# Patient Record
Sex: Male | Born: 1950 | Race: White | Hispanic: No | Marital: Married | Smoking: Never smoker
Health system: Southern US, Community
[De-identification: ages and names within clinical notes are randomized; demographics above are authoritative.]

## PROBLEM LIST (undated history)

## (undated) DIAGNOSIS — N200 Calculus of kidney: Secondary | ICD-10-CM

## (undated) DIAGNOSIS — K589 Irritable bowel syndrome without diarrhea: Secondary | ICD-10-CM

## (undated) DIAGNOSIS — I10 Essential (primary) hypertension: Secondary | ICD-10-CM

## (undated) DIAGNOSIS — K219 Gastro-esophageal reflux disease without esophagitis: Secondary | ICD-10-CM

## (undated) DIAGNOSIS — J45909 Unspecified asthma, uncomplicated: Secondary | ICD-10-CM

## (undated) DIAGNOSIS — K56609 Unspecified intestinal obstruction, unspecified as to partial versus complete obstruction: Secondary | ICD-10-CM

## (undated) DIAGNOSIS — Z87442 Personal history of urinary calculi: Secondary | ICD-10-CM

## (undated) DIAGNOSIS — G4730 Sleep apnea, unspecified: Secondary | ICD-10-CM

## (undated) HISTORY — PX: BACK SURGERY: SHX140

## (undated) HISTORY — PX: ANTERIOR CRUCIATE LIGAMENT REPAIR: SHX115

## (undated) HISTORY — PX: EXPLORATORY LAPAROTOMY: SUR591

## (undated) HISTORY — DX: Calculus of kidney: N20.0

## (undated) HISTORY — PX: CHOLECYSTECTOMY: SHX55

## (undated) HISTORY — PX: APPENDECTOMY: SHX54

## (undated) HISTORY — PX: KNEE SURGERY: SHX244

## (undated) HISTORY — DX: Irritable bowel syndrome, unspecified: K58.9

---

## 2000-11-01 ENCOUNTER — Ambulatory Visit (HOSPITAL_COMMUNITY): Admission: RE | Admit: 2000-11-01 | Discharge: 2000-11-01 | Payer: Self-pay | Admitting: Family Medicine

## 2000-11-01 ENCOUNTER — Encounter: Payer: Self-pay | Admitting: Family Medicine

## 2001-02-23 HISTORY — PX: CHOLECYSTECTOMY: SHX55

## 2001-04-11 ENCOUNTER — Emergency Department (HOSPITAL_COMMUNITY): Admission: EM | Admit: 2001-04-11 | Discharge: 2001-04-11 | Payer: Self-pay | Admitting: *Deleted

## 2001-04-11 ENCOUNTER — Encounter: Payer: Self-pay | Admitting: *Deleted

## 2001-04-12 ENCOUNTER — Encounter: Payer: Self-pay | Admitting: Internal Medicine

## 2001-04-12 ENCOUNTER — Ambulatory Visit (HOSPITAL_COMMUNITY): Admission: RE | Admit: 2001-04-12 | Discharge: 2001-04-12 | Payer: Self-pay | Admitting: Internal Medicine

## 2001-04-13 ENCOUNTER — Ambulatory Visit (HOSPITAL_COMMUNITY): Admission: RE | Admit: 2001-04-13 | Discharge: 2001-04-13 | Payer: Self-pay | Admitting: Internal Medicine

## 2001-04-13 ENCOUNTER — Encounter: Payer: Self-pay | Admitting: Internal Medicine

## 2002-02-20 ENCOUNTER — Emergency Department (HOSPITAL_COMMUNITY): Admission: EM | Admit: 2002-02-20 | Discharge: 2002-02-20 | Payer: Self-pay | Admitting: Emergency Medicine

## 2002-02-20 ENCOUNTER — Encounter: Payer: Self-pay | Admitting: Emergency Medicine

## 2002-05-05 ENCOUNTER — Ambulatory Visit (HOSPITAL_COMMUNITY): Admission: RE | Admit: 2002-05-05 | Discharge: 2002-05-05 | Payer: Self-pay | Admitting: Family Medicine

## 2002-05-05 ENCOUNTER — Encounter: Payer: Self-pay | Admitting: Family Medicine

## 2002-07-03 ENCOUNTER — Encounter: Payer: Self-pay | Admitting: Family Medicine

## 2002-07-03 ENCOUNTER — Ambulatory Visit (HOSPITAL_COMMUNITY): Admission: RE | Admit: 2002-07-03 | Discharge: 2002-07-03 | Payer: Self-pay | Admitting: Family Medicine

## 2002-07-25 HISTORY — PX: COLONOSCOPY: SHX174

## 2002-08-17 ENCOUNTER — Ambulatory Visit (HOSPITAL_COMMUNITY): Admission: RE | Admit: 2002-08-17 | Discharge: 2002-08-17 | Payer: Self-pay | Admitting: Family Medicine

## 2002-08-17 ENCOUNTER — Ambulatory Visit (HOSPITAL_COMMUNITY): Admission: RE | Admit: 2002-08-17 | Discharge: 2002-08-17 | Payer: Self-pay | Admitting: Internal Medicine

## 2002-08-17 ENCOUNTER — Encounter: Payer: Self-pay | Admitting: Family Medicine

## 2002-09-07 ENCOUNTER — Encounter (HOSPITAL_COMMUNITY): Admission: RE | Admit: 2002-09-07 | Discharge: 2002-10-07 | Payer: Self-pay | Admitting: General Surgery

## 2002-09-07 ENCOUNTER — Encounter: Payer: Self-pay | Admitting: General Surgery

## 2002-10-04 ENCOUNTER — Encounter: Payer: Self-pay | Admitting: General Surgery

## 2002-10-04 ENCOUNTER — Inpatient Hospital Stay (HOSPITAL_COMMUNITY): Admission: RE | Admit: 2002-10-04 | Discharge: 2002-10-05 | Payer: Self-pay | Admitting: General Surgery

## 2003-04-20 ENCOUNTER — Ambulatory Visit (HOSPITAL_COMMUNITY): Admission: RE | Admit: 2003-04-20 | Discharge: 2003-04-20 | Payer: Self-pay | Admitting: Family Medicine

## 2005-06-25 ENCOUNTER — Ambulatory Visit (HOSPITAL_COMMUNITY): Admission: RE | Admit: 2005-06-25 | Discharge: 2005-06-25 | Payer: Self-pay | Admitting: Family Medicine

## 2006-06-23 ENCOUNTER — Ambulatory Visit: Payer: Self-pay | Admitting: Internal Medicine

## 2006-07-22 ENCOUNTER — Ambulatory Visit: Payer: Self-pay | Admitting: Internal Medicine

## 2007-03-11 ENCOUNTER — Ambulatory Visit (HOSPITAL_COMMUNITY): Admission: RE | Admit: 2007-03-11 | Discharge: 2007-03-11 | Payer: Self-pay | Admitting: Family Medicine

## 2007-06-08 ENCOUNTER — Ambulatory Visit (HOSPITAL_COMMUNITY): Admission: RE | Admit: 2007-06-08 | Discharge: 2007-06-08 | Payer: Self-pay | Admitting: Family Medicine

## 2007-06-14 ENCOUNTER — Ambulatory Visit (HOSPITAL_COMMUNITY): Admission: RE | Admit: 2007-06-14 | Discharge: 2007-06-14 | Payer: Self-pay | Admitting: Family Medicine

## 2007-07-25 ENCOUNTER — Ambulatory Visit (HOSPITAL_COMMUNITY): Payer: Self-pay | Admitting: Oncology

## 2007-07-25 ENCOUNTER — Encounter (HOSPITAL_COMMUNITY): Admission: RE | Admit: 2007-07-25 | Discharge: 2007-08-24 | Payer: Self-pay | Admitting: Oncology

## 2007-08-31 ENCOUNTER — Encounter (HOSPITAL_COMMUNITY): Admission: RE | Admit: 2007-08-31 | Discharge: 2007-09-30 | Payer: Self-pay | Admitting: Oncology

## 2007-09-14 ENCOUNTER — Ambulatory Visit: Payer: Self-pay | Admitting: Internal Medicine

## 2007-10-03 ENCOUNTER — Encounter (INDEPENDENT_AMBULATORY_CARE_PROVIDER_SITE_OTHER): Payer: Self-pay | Admitting: Interventional Radiology

## 2007-10-03 ENCOUNTER — Ambulatory Visit (HOSPITAL_COMMUNITY): Admission: RE | Admit: 2007-10-03 | Discharge: 2007-10-03 | Payer: Self-pay | Admitting: Internal Medicine

## 2008-01-11 ENCOUNTER — Ambulatory Visit: Payer: Self-pay | Admitting: Internal Medicine

## 2008-01-11 ENCOUNTER — Ambulatory Visit (HOSPITAL_COMMUNITY): Admission: RE | Admit: 2008-01-11 | Discharge: 2008-01-11 | Payer: Self-pay | Admitting: Internal Medicine

## 2008-01-18 ENCOUNTER — Ambulatory Visit (HOSPITAL_COMMUNITY): Admission: RE | Admit: 2008-01-18 | Discharge: 2008-01-18 | Payer: Self-pay | Admitting: General Surgery

## 2008-03-15 ENCOUNTER — Ambulatory Visit (HOSPITAL_COMMUNITY): Payer: Self-pay | Admitting: Oncology

## 2008-03-15 ENCOUNTER — Encounter (HOSPITAL_COMMUNITY): Admission: RE | Admit: 2008-03-15 | Discharge: 2008-04-14 | Payer: Self-pay | Admitting: Oncology

## 2008-04-16 ENCOUNTER — Ambulatory Visit (HOSPITAL_COMMUNITY): Admission: RE | Admit: 2008-04-16 | Discharge: 2008-04-16 | Payer: Self-pay | Admitting: Family Medicine

## 2008-10-05 ENCOUNTER — Ambulatory Visit (HOSPITAL_BASED_OUTPATIENT_CLINIC_OR_DEPARTMENT_OTHER): Admission: RE | Admit: 2008-10-05 | Discharge: 2008-10-05 | Payer: Self-pay | Admitting: Otolaryngology

## 2008-10-05 ENCOUNTER — Encounter (INDEPENDENT_AMBULATORY_CARE_PROVIDER_SITE_OTHER): Payer: Self-pay | Admitting: Otolaryngology

## 2008-10-26 ENCOUNTER — Telehealth (INDEPENDENT_AMBULATORY_CARE_PROVIDER_SITE_OTHER): Payer: Self-pay | Admitting: *Deleted

## 2009-08-15 ENCOUNTER — Ambulatory Visit (HOSPITAL_COMMUNITY): Admission: RE | Admit: 2009-08-15 | Discharge: 2009-08-15 | Payer: Self-pay | Admitting: Family Medicine

## 2009-11-08 ENCOUNTER — Inpatient Hospital Stay (HOSPITAL_COMMUNITY): Admission: EM | Admit: 2009-11-08 | Discharge: 2009-11-11 | Payer: Self-pay | Admitting: Emergency Medicine

## 2010-05-08 LAB — COMPREHENSIVE METABOLIC PANEL
ALT: 15 U/L (ref 0–53)
Albumin: 3.5 g/dL (ref 3.5–5.2)
Alkaline Phosphatase: 43 U/L (ref 39–117)
BUN: 20 mg/dL (ref 6–23)
CO2: 26 mEq/L (ref 19–32)
GFR calc non Af Amer: >60 mL/min (ref >60)
Potassium: 3.6 mEq/L (ref 3.5–5.1)
Total Protein: 6.6 g/dL (ref 6.0–8.3)

## 2010-05-08 LAB — CBC
Platelets: 124 10*3/uL — ABNORMAL LOW (ref 150–400)
RDW: 13.4 % (ref 11.5–15.5)
WBC: 3.5 10*3/uL — ABNORMAL LOW (ref 4.0–10.5)

## 2010-05-08 LAB — DIFFERENTIAL
Basophils Absolute: 0 10*3/uL (ref 0.0–0.1)
Lymphocytes Relative: 21 % (ref 12–46)
Lymphs Abs: 0.7 10*3/uL (ref 0.7–4.0)
Monocytes Relative: 16 % — ABNORMAL HIGH (ref 3–12)
Neutrophils Relative %: 61 % (ref 43–77)

## 2010-05-08 LAB — URINE MICROSCOPIC-ADD ON

## 2010-05-08 LAB — URINALYSIS, ROUTINE W REFLEX MICROSCOPIC
Leukocytes, UA: NEGATIVE
Nitrite: NEGATIVE
Urobilinogen, UA: 0.2 mg/dL (ref 0.0–1.0)
pH: 6 (ref 5.0–8.0)

## 2010-05-08 LAB — LIPASE, BLOOD: Lipase: 29 U/L (ref 11–59)

## 2010-05-08 LAB — AMYLASE: Amylase: 32 U/L (ref 0–105)

## 2010-06-09 LAB — CBC
HCT: 41.4 % (ref 39.0–52.0)
Hemoglobin: 14.1 g/dL (ref 13.0–17.0)
MCV: 85.4 fL (ref 78.0–100.0)
Platelets: 142 10*3/uL — ABNORMAL LOW (ref 150–400)
RBC: 4.85 MIL/uL (ref 4.22–5.81)

## 2010-07-08 NOTE — Assessment & Plan Note (Signed)
NAME:  Justin [name], Justin [name]               CHART#:  [id number]   DATE:  01/11/2008                       DOB:  01-Nov-1950   The patient has done very well from standpoint of his irritable bowel  syndrome symptoms.  However, he developed acute symptoms characterized  by abdominal distention, pain, nausea, vomiting, and diminution in bowel  movements over the past 3 days.  Pain became severe yesterday.  He saw  the folks down at [facility].  He was given Avelox and  some oxycodone and was told to follow up.  He called in yesterday, we  arranged to see him today.  He stated overnight, he felt the abdominal  pressure distention resolving and he started passing gas and had a bowel  movement.  His pain has become much improved.  He relates a similar  episode back in 2007 which also resolved spontaneously.   Back in 1997, he had a similar episode for which he was admitted down at  [facility] and had surgery and from his description, there were  some adhesive disease producing a small bowel obstruction.  Generally,  he has done very well, has altering constipation, diarrhea, and takes  Align 1 capsule daily chronically.  He has been worked up here for a  splenomegaly.  He does have a mild fatty-appearing liver on CT.  Moreover, he had a transjugular liver biopsy and had hepatic vein wedge  pressure measurements done, recent which demonstrated bland steatosis  and no evidence of cirrhosis based on histology or portal wedge  pressures.  He has not had fever or chills.  He is feeling much better  today.   CURRENT MEDICATIONS:  See her updated list in addition to above  symptoms.  He has chronic gastroesophageal reflux disease for which  Nexium 40 mg orally daily control symptoms nicely.   ALLERGIES:  No known drug allergies.   PHYSICAL EXAMINATION:  GENERAL:  He is accompanied by his wife.  VITAL SIGNS:  Weight 213, height 6 feet, temperature 98, BP 112/80, and  pulse 76.  SKIN:  Warm and dry.  There is no jaundice or cutaneous stigmata of  chronic liver disease.  CHEST:  Lungs are clear to auscultation.  CARDIAC:  Regular rate and rhythm without murmur, gallop, or rub.  ABDOMEN:  Full.  He has bowel sounds.  There is marked increased tympany  throughout both upper quadrants.  He has mild tenderness in these areas,  but certainly no appreciable mass.  Spleen is barely ballottable.  There  is no guarding, rebound, or tenderness.  EXTREMITIES:  Have trace lower extremity edema.   IMPRESSION:  A 61 year old gentleman with stable irritable bowel  syndrome symptoms and gastroesophageal reflux disease with acute illness  which has spontaneously improved over the past 24 hours, highly  suspicious for partial small bowel obstruction.  He had a similar a self-  limiting episode in 2007 and had surgery and what sounds like lysis of  adhesions about 12 years ago down in [place].  At this point, he did  have a CT in June 2009 which demonstrated no bowel abnormalities even  though this process seems to be resolving on his own, it will be worth  while he to go ahead and get a CT of his abdomen and pelvis with IV  normal contrast, so forth see if we cannot localize problem.  The  frustrating nature of adhesions I reviewed with the patient and the  patient's wife.  A short of an out nap ongoing obstruction, treatment  options quite limited short of surgery.  I have told the patient as in  the absence of an ongoing obstruction or other surgical process, he is  not likely going to be offered an operation by a Careers adviser.  Moreover, CT  scan would help [place] rule out other potential underlying pathology  contributing to this acute illness.  At this point, he certainly does  not look acutely ill or toxic and do not feel there is any type of  emergent surgical process.  Pending review of CT.  We will make further  recommendations in the very near future.   As far as his  splenomegaly is concerned, he has been thoroughly  evaluated from standpoint of potential underlying chronic liver disease  and I have advised him to follow up with Dr. Sherwood Gambler and Dr. Mariel Sleet as  previously directed.       Jonathon Bellows, M.D.  Electronically Signed     RMR/MEDQ  D:  01/11/2008  T:  01/12/2008  Job:  [id number]   cc:   Ladona Horns. Mariel Sleet, MD  Madelin Rear. Sherwood Gambler, MD

## 2010-07-08 NOTE — Op Note (Signed)
NAME:  Justin, [name] NO.:  [id number]   MEDICAL RECORD NO.:  [id number]          PATIENT TYPE:  AMB   LOCATION:  DSC                          FACILITY:  [facility]   PHYSICIAN:  Kristine Garbe. Ezzard Standing, M.D.DATE OF BIRTH:  Jul 18, 1950   DATE OF PROCEDURE:  10/05/2008  DATE OF DISCHARGE:                               OPERATIVE REPORT   PREOPERATIVE DIAGNOSIS:  Right buccal mucosa polypoid lesion, 1 to 1.5  cm.   POSTOPERATIVE DIAGNOSIS:  Right buccal mucosa polypoid lesion, 1 to 1.5  cm.   OPERATION:  Excision of right buccal mucosa polypoid lesion with simple  closure.   SURGEON:  Kristine Garbe. Ezzard Standing, MD   ANESTHESIA:  Local 1% Xylocaine with epinephrine.   COMPLICATIONS:  None.   BRIEF CLINICAL NOTE:  Justin [name] is a 60 year old gentleman who has  had a polypoid lesion of the right buccal mucosa for several years.  He  intermittently bites it and it causes problems and bleeds occasionally.  On examination, he has about approximately 1-cm mucosa covered polypoid  lesion with a thick base coming off the right buccal mucosa.  He is  taken to the operating room at this time for excision under local  anesthetic.   DESCRIPTION OF PROCEDURE:  The patient was brought to the operating  room.  The polypoid lesion was injected with 2 mL of Xylocaine with  epinephrine.  The polyp was then excised and sent to pathology and the  small defect was closed with 2 interrupted 5-0 chromic sutures.  Justin [name]  tolerated this well and was subsequently discharged home.   DISPOSITION:  Justin [name] is discharged home on Tylenol and Motrin p.r.n. pain.  He will call our office next week concerning results of the pathology.  He will call our office if he has any questions or problems.           ______________________________  Kristine Garbe Ezzard Standing, M.D.     CEN/MEDQ  D:  10/05/2008  T:  10/05/2008  Job:  [id number]   cc:   Kirk Ruths, M.D.

## 2010-07-08 NOTE — Assessment & Plan Note (Signed)
NAMEMarland Kitchen  HARRIE, [name]               CHART#:  [id number]   DATE:  07/22/2006                       DOB:  10/05/1950   FOLLOWUP:  1. Abdominal bloating, constipation and alternating diarrhea      consistent with irritable bowel syndrome.  2. Gastroesophageal reflux disease.  3. Splenomegaly, etiology uncertain.  4. Self-limiting epigastric right upper quadrant pain 2004, negative      EGD.  Screening colonoscopy negative June 2004.   Justin [name] came to see me at the request of Dr. Mila [name] June 23, 2006  for flare of his irritable syndrome symptoms.  He was hemoccult  negative.  [name] antibody panel chem. 20 sed rate.  CBC all came back  entirely normal, except for a slight suppressed white count of 3.4,  platelet count 160,000.  Ultrasound at [name] demonstrated  an enlarged spleen.  He has done well on Benefiber 1 tablespoon daily,  Align probiotic supplement, and Levbid each morning.  It has really  turned him around.  He is not having anymore abdominal cramps or  bloating.  He feels well.  He is eating well.  He has lost 3 pounds, but  he is trying to keep his weight in line.  Reflux symptoms are well  controlled on Nexium.   CURRENT MEDICATIONS:  See updated list.   ALLERGIES:  NO KNOWN DRUG ALLERGIES:   EXAMINATION:  He looks well.  Weight 213.  Height 6 feet.  BP 136/92.  Pulse 72.  SKIN:  Warm and dry.  CARDIAC EXAM:  Regular rate and rhythm without murmur, gallop, or rub.  ABDOMEN:  Non-distended.  Positive bowel sounds.  I do ballot the  spleen.  The abdomen is entirely non-tender.   ASSESSMENT:  Abdominal bloating, discomfort, alternating constipation  and diarrhea.  Almost certainly irritable bowel syndrome is the  diagnosis.  Reflux symptoms well controlled on Nexium.  I had a length  discussion with him today about the benign, but chronic nature of  irritable bowel syndrome.   RECOMMENDATIONS:  1. Benefiber is going to be the foundation of his  bowel regimen, 1      tablespoon every day from here on out. He may come on and off Align      as needed on daily to every other day as a probiotic supplement.  2. Would reserve Levbid in case of flares of abdominal bloating and      diarrhea.  3. Splenomegaly.  Uncertain etiology.  Justin [name] is slated to see      Dr. Regino [name] back in      August.  I would suggest a one-time referral to the hematologist to      get his opinion.  Otherwise, I will plan to see this nice gentleman      back in 1 year.       Justin [name], M.D.  Electronically Signed     RMR/MEDQ  D:  07/22/2006  T:  07/22/2006  Job:  [id number]   cc:   Justin [name], M.D.

## 2010-07-08 NOTE — Assessment & Plan Note (Signed)
NAME:  Justin [name], Justin [name]               CHART#:  [id number]   DATE:  09/14/2007                       DOB:  1951-01-08   He was seen here earlier this year with abdominal bloating,  constipation, alternating constipation and diarrhea.  We started him on  some Levbid and Align.  He did not feel Levbid made much difference, but  however he feels thatAlign is a miracle drug, it normalizes his bowel  function and minimizes his abdominal bloating.  He has been taking it  effectively.  I noted he had slightly depressed platelet count and  enlarged spleen on imaging.  He saw Dr. Mariel Sleet, who has worked him up  and was concerned about the possibility of occult liver disease with  some fatty changes on prior CT.  His LFTs have been repeatedly normal.  There is no family history of liver disease.  I do not recall Dr. Katrinka Blazing  finding anything at the time of laparoscopic cholecystectomy that  suggests chronic liver disease, but that operative report is not  available for review.   EGD by me in 2004 demonstrated no upper GI tract changes consistent with  portal gastropathy and cirrhosis.  The patient has never consumed  alcohol, and again he has had repeatedly normal LFTs.  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   There was a foul-up on some labs or laboratory error at the Dr.  Thornton Papas office.  At one point, he was found to be anemic at 9.8, but  the repeat labs demonstrated a normal hemoglobin per patient report and  his platelet count was more recently determined to be normal as well.  He was Hemoccult negative x6 through Dr. Thornton Papas office.  Colonoscopy back on August 17, 2002, demonstrated a long, tortuous  otherwise normal colon.  Colonoscopy at that time was done for  screening.   The patient weighs 260 pounds and is 6 feet in stature.  He at one point  weighed in the mid 250s, his weight is at least as little as 180 pounds  and according to height and  body charts he ought to be running between  155 but 170 pounds.   The patient  tells me he has had a low platelet count for at least 20  years on routine lab studies as was reported to him by several different  physicians.   CURRENT MEDICATIONS:  1. Nexium 40 mg orally daily.  2. Align once daily.  3. Benefiber p.r.n.   ALLERGIES:  No known drug allergies.   PAST MEDICAL HISTORY:  Irritable bowel syndrome.   PAST SURGICAL HISTORY:  Past surgeries aside from I mentioned above,  appendectomy, bilateral knee surgery, and vasectomy.   ALLERGIES:  He has no known drug allergies.   FAMILY HISTORY:  Negative for chronic GI or liver illness.   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  He  chews tobacco and no alcohol.   PHYSICAL EXAMINATION:  GENERAL:  A pleasant, tanned gentleman in no  acute distress.  VITAL SIGNS:  Weight 216, height 6 feet, temp 98.6, BP 130/88, and pulse  __________.  SKIN:  Warm and dry, tanned, no cutaneous stigmata of chronic liver  disease.  HEENT:  No scleral icterus. Conjunctivae are pink.  CHEST:  Lungs are clear to auscultation.  CARDIAC:  Regular rate and rhythm without murmur, gallop, or rub.  ABDOMEN:  Flat.  Positive bowel sounds.  Soft.  I believe I do  __________ his spleen on today's exam.  His liver is not enlarged by  percussion.  There are no masses.  EXTREMITIES:  No edema.   IMPRESSION:  The patient is a pleasant 60 year old gentleman with  irritable bowel syndrome, those symptoms are quiescent now.  He does  have splenomegaly and low platelet count; however, the LFT assays I have  on him demonstrate complete normal LFTs.  He is somewhat over his ideal  body weight and some mild fatty changes suggested on recent CT scan.  However, I doubt the patient has anywhere near advanced chronic liver  disease.  He has albumin of 4.0 which implies preserved hepatic  synthetic function.  He may have an element of  steatosis from being  overweight, genetic factors may also be at play.  He is tanned, but he  works outside all the time.  I would like to review Dr. Michaelle Copas  operative notes to see if he has mentioned anything abnormal about the  liver or see if liver biopsy by chance was done, which I doubt.   RECOMMENDATIONS:  1. In this setting, I will go ahead and screen him for viral hepatitis      even though only assay is done along the way.  I do not see any      elevations in transaminases, therefore we will go ahead and proceed      with a hepatitis B surface antigen and hepatitis C antibody and      screening for iron overload.  We will review Dr. Michaelle Copas operative      note and will make further recommendations in the very near future.      As far as his IBS symptoms are concerned, he is getting a lot of      mileage out of nothing more than a probiotic.  I have recommended      he continue with Align 1 Gelcap daily indefinitely.  Further      recommendations to follow.  2. Strive to achieve an ideal body weight realistically.  If he could      get his weight down towards 180 pounds, I think that would be      great.  Regular aerobic exercise was also emphasized.       Justin [name], M.D.  Electronically Signed     RMR/MEDQ  D:  09/14/2007  T:  09/15/2007  Job:  [id number]   cc:   Ladona Horns. Mariel Sleet, MD  Madelin Rear. Sherwood Gambler, MD

## 2010-07-11 NOTE — Discharge Summary (Signed)
NAME:  Justin [name], Justin [name]                        ACCOUNT NO.:  [id number]   MEDICAL RECORD NO.:  [id number]                   PATIENT TYPE:  INP   LOCATION:  A201                                 FACILITY:  [facility]   PHYSICIAN:  Dirk Dress. Katrinka Blazing, M.D.                DATE OF BIRTH:  [birth year]   DATE OF ADMISSION:  10/03/2002  DATE OF DISCHARGE:  10/05/2002                                 DISCHARGE SUMMARY   DISCHARGE DIAGNOSIS:  Chronic cholecystitis with severe biliary colic and  extensive intra-abdominal adhesions.   SPECIAL PROCEDURE:  October 03, 2002, open cholecystectomy.   DISPOSITION:  The patient is discharged home in stable and improved  condition.   DISCHARGE MEDICATIONS:  1. Tylox 2 every four hours as needed for pain.  2. Nexium 40 mg daily.  3. Albuterol MDI 2 puffs q.i.d.   FOLLOW-UP:  The patient is scheduled to be seen in the office on August 18  and will have follow-up with Dr. Regino Schultze on October 19, 2002.   SUMMARY:  This is a 60 year old male with a history of recurrent epigastric  pain and nausea without vomiting.  He had episodes of nausea, bloating, and  diarrhea.  EGD and colonoscopy were normal.  Ultrasound was normal.  HIDA  scan showed decreased ejection fraction.  The patient remained symptomatic  and was scheduled for laparoscopic cholecystectomy.  He had no other major  illnesses.  Physical examination was normal except for mild epigastric  tenderness.   The patient was admitted through Day Surgery.  He was originally scheduled  for laparoscopic cholecystectomy.  I was unable to gain access to the  peritoneal cavity with open dissection, so he was converted from a  laparoscopic procedure to open cholecystectomy, which was done uneventfully.  In the early postoperative period the patient had an episode of  hypoventilation after receiving morphine sulfate IV.  He was then later  given Narcan.  After he was given Narcan his O2 saturations increased.  He  was still mildly hypoxemic on 4 liters of O2 with saturation of 93%.  He had  a good ventilatory effort with respirations.  He was transferred to CC NT  for continuous O2 saturation monitoring and monitoring of blood pressure and  pulse.  Chest x-ray was unremarkable.  The morphine sulfate was  discontinued, and he was given Toradol for pain.  He remained stable from  that point on.  The following morning his oxygen saturation on rule out was  94%.  He was doing well.  His lungs were clear, vital signs were stable, and  he was discharged home in satisfactory condition.     ___________________________________________                                         Dirk Dress Katrinka Blazing,  M.D.   LCS/MEDQ  D:  12/17/2002  T:  12/17/2002  Job:  [id number]

## 2010-07-11 NOTE — H&P (Signed)
   NAME:  Justin [name]                        ACCOUNT NO.:  [id number]   MEDICAL RECORD NO.:  [id number]                   PATIENT TYPE:  AMB   LOCATION:  DAY                                  FACILITY:  [facility]   PHYSICIAN:  Dirk Dress. Katrinka Blazing, M.D.                DATE OF BIRTH:  [birth year]   DATE OF ADMISSION:  DATE OF DISCHARGE:                                HISTORY & PHYSICAL   HISTORY OF PRESENT ILLNESS:  Justin [name] is a 60 year old male with  history of epigastric pain and nausea without vomiting.  He had an acute  attack in late June.  He has episodes of bloating, and episodic constipation  and diarrhea.  EGD and colonoscopy were normal.  Gallbladder ultrasound  was  normal.  He was treated with Nexium and NuLev without improvement.  HIDA  scan showed decreased ejection fraction.  The patient remains symptomatic  and is scheduled for laparoscopic cholecystectomy.   PAST HISTORY:  The patient has chronic seasonal allergies.  There is a  history of bilateral arthroscopic knee surgery and there is a history of  exploratory laparotomy.   MEDICATIONS:  1. NuLev 0.125 mg every six hours.  2. Nexium 40 daily.  3. Skelaxin 800 mg t.i.d. p.r.n.   SOCIAL HISTORY:  The patient is employed.  He chews tobacco.  He does not  smoke, drink or use drugs.   FAMILY HISTORY:  Family history is positive for diabetes mellitus.   PHYSICAL EXAMINATION:  VITAL SIGNS:  On examination blood pressure 128/68,  pulse 78, respirations 16 and weight 206 pounds.  HEENT:  Unremarkable.  NECK:  Supple without JVD or bruit.  CHEST:  Clear to auscultation.  HEART: Regular rate and rhythm without murmur, gallop or rub.  ABDOMEN: Mild epigastric tenderness.  Normal bowel sounds.  EXTREMITIES:  No cyanosis, clubbing or edema.  NEUROLOGIC EXAMINATION:  No focal motor, sensory or cerebellar deficits.    IMPRESSION:  Chronic cholecystitis with severe biliary colic.   PLAN:  Laparoscopic  cholecystectomy.                                                 Dirk Dress. Katrinka Blazing, M.D.    LCS/MEDQ  D:  10/02/2002  T:  10/03/2002  Job:  [id number]

## 2010-11-20 LAB — DIFFERENTIAL

## 2010-11-20 LAB — COMPREHENSIVE METABOLIC PANEL
AST: 20
Albumin: 3.9
Calcium: 9.2
Chloride: 107
Creatinine, Ser: 1.12
GFR calc Af Amer: >60
GFR calc Af Amer: >60
Sodium: 143

## 2010-11-20 LAB — IRON AND TIBC
Iron: 86
Saturation Ratios: 26
TIBC: 327
UIBC: 241

## 2010-11-20 LAB — CBC
HCT: 41.9
Hemoglobin: 14.9
MCHC: 35.7
MCV: 84.2
Platelets: 147 — ABNORMAL LOW
RBC: 4.97
RDW: 12.8
WBC: 5.3

## 2010-11-20 LAB — OCCULT BLOOD X 1 CARD TO LAB, STOOL
Fecal Occult Bld: NEGATIVE
Fecal Occult Bld: NEGATIVE
Fecal Occult Bld: NEGATIVE
Fecal Occult Bld: NEGATIVE

## 2010-11-20 LAB — FOLATE: Folate: >20

## 2010-11-20 LAB — FERRITIN

## 2010-11-20 LAB — RETICULOCYTES: Retic Ct Pct: 1.6

## 2010-11-20 LAB — VITAMIN B12: Vitamin B-12: 294 (ref 211–911)

## 2010-11-21 LAB — CBC
MCHC: 34.3
MCV: 86.2
Platelets: 128 — ABNORMAL LOW
RDW: 12.6

## 2010-11-21 LAB — PROTIME-INR: Prothrombin Time: 13.2

## 2010-11-25 LAB — CREATININE, SERUM: Creatinine, Ser: 1.13

## 2010-11-25 LAB — BUN: BUN: 10

## 2011-12-10 ENCOUNTER — Other Ambulatory Visit (HOSPITAL_COMMUNITY): Payer: Self-pay | Admitting: Internal Medicine

## 2011-12-10 ENCOUNTER — Encounter (HOSPITAL_COMMUNITY): Payer: Self-pay | Admitting: Family Medicine

## 2011-12-10 ENCOUNTER — Emergency Department (HOSPITAL_COMMUNITY): Payer: 59

## 2011-12-10 ENCOUNTER — Ambulatory Visit (HOSPITAL_COMMUNITY)
Admission: RE | Admit: 2011-12-10 | Discharge: 2011-12-10 | Disposition: A | Discharge status: Home / Self Care | Payer: 59 | Source: Ambulatory Visit | Attending: Internal Medicine | Admitting: Internal Medicine

## 2011-12-10 ENCOUNTER — Emergency Department (HOSPITAL_COMMUNITY)
Admission: EM | Admit: 2011-12-10 | Discharge: 2011-12-10 | Disposition: A | Discharge status: Home / Self Care | Payer: 59 | Attending: Emergency Medicine | Admitting: Emergency Medicine

## 2011-12-10 DIAGNOSIS — J984 Other disorders of lung: Secondary | ICD-10-CM | POA: Insufficient documentation

## 2011-12-10 DIAGNOSIS — R1013 Epigastric pain: Secondary | ICD-10-CM

## 2011-12-10 DIAGNOSIS — K56600 Partial intestinal obstruction, unspecified as to cause: Secondary | ICD-10-CM

## 2011-12-10 DIAGNOSIS — R109 Unspecified abdominal pain: Secondary | ICD-10-CM | POA: Insufficient documentation

## 2011-12-10 DIAGNOSIS — I10 Essential (primary) hypertension: Secondary | ICD-10-CM | POA: Insufficient documentation

## 2011-12-10 DIAGNOSIS — K56609 Unspecified intestinal obstruction, unspecified as to partial versus complete obstruction: Secondary | ICD-10-CM | POA: Insufficient documentation

## 2011-12-10 DIAGNOSIS — R1110 Vomiting, unspecified: Secondary | ICD-10-CM | POA: Insufficient documentation

## 2011-12-10 DIAGNOSIS — K219 Gastro-esophageal reflux disease without esophagitis: Secondary | ICD-10-CM | POA: Insufficient documentation

## 2011-12-10 HISTORY — DX: Unspecified intestinal obstruction, unspecified as to partial versus complete obstruction: K56.609

## 2011-12-10 HISTORY — DX: Essential (primary) hypertension: I10

## 2011-12-10 HISTORY — DX: Gastro-esophageal reflux disease without esophagitis: K21.9

## 2011-12-10 LAB — CBC WITH DIFFERENTIAL/PLATELET
Basophils Absolute: 0 10*3/uL (ref 0.0–0.1)
Basophils Relative: 0 % (ref 0–1)
Eosinophils Relative: 2 % (ref 0–5)
HCT: 45.5 % (ref 39.0–52.0)
MCHC: 34.7 g/dL (ref 30.0–36.0)
MCV: 83.5 fL (ref 78.0–100.0)
Monocytes Absolute: 0.5 10*3/uL (ref 0.1–1.0)
RDW: 13 % (ref 11.5–15.5)

## 2011-12-10 LAB — POCT I-STAT, CHEM 8
BUN: 21 mg/dL (ref 6–23)
Calcium, Ion: 1.2 mmol/L (ref 1.13–1.30)
HCT: 48 % (ref 39.0–52.0)
TCO2: 27 mmol/L (ref 0–100)

## 2011-12-10 MED ORDER — IOHEXOL 300 MG/ML  SOLN
20.0000 mL | INTRAMUSCULAR | Status: AC
  Administered 2011-12-10 (×2): 20 mL via ORAL

## 2011-12-10 MED ORDER — IOHEXOL 300 MG/ML  SOLN
100.0000 mL | Freq: Once | INTRAMUSCULAR | Status: AC | PRN
  Administered 2011-12-10: 100 mL via INTRAVENOUS

## 2011-12-10 MED ORDER — SODIUM CHLORIDE 0.9 % IV BOLUS (SEPSIS)
500.0000 mL | Freq: Once | INTRAVENOUS | Status: AC
  Administered 2011-12-10: 500 mL via INTRAVENOUS

## 2011-12-10 NOTE — ED Notes (Signed)
Pt sts Monday had one episode of N,V on Monday. sts stomach sore. Pt sent here from PCP with xray positive for poss small bowel obstruction. Hx of same.

## 2011-12-10 NOTE — ED Provider Notes (Signed)
History     CSN: [id number]  Arrival date & time 12/10/11  1642   First MD Initiated Contact with Patient 12/10/11 1839      Chief Complaint  Patient presents with  . Abdominal Pain    (Consider location/radiation/quality/duration/timing/severity/associated sxs/prior treatment) The history is provided by the patient.   patient was sent in for possible bowel obstruction. Had an outpatient x-ray that showed bowel obstruction. He has a history of same. For this we had nausea vomiting and some sore. It is since improved somewhat. Patient states that his doctors didn't know to be able go back to work. He has not had a bowel movement in a couple days, but states he still passing gas. He had a previous abdominal exploration after bowel obstruction.  Past Medical History  Diagnosis Date  . Small bowel obstruction   . Acid reflux   . Hypertension     History reviewed. No pertinent past surgical history.  No family history on file.  History  Substance Use Topics  . Smoking status: Never Smoker   . Smokeless tobacco: Not on file  . Alcohol Use: No      Review of Systems  Constitutional: Negative for activity change and appetite change.  HENT: Negative for neck stiffness.   Eyes: Negative for pain.  Respiratory: Negative for chest tightness and shortness of breath.   Cardiovascular: Negative for chest pain and leg swelling.  Gastrointestinal: Positive for abdominal pain. Negative for nausea, vomiting and diarrhea.  Genitourinary: Negative for flank pain.  Musculoskeletal: Negative for back pain.  Skin: Negative for rash.  Neurological: Negative for weakness, numbness and headaches.  Psychiatric/Behavioral: Negative for behavioral problems.    Allergies  Review of patient's allergies indicates no known allergies.  Home Medications   Current Outpatient Rx  Name Route Sig Dispense Refill  . ESOMEPRAZOLE MAGNESIUM 40 MG PO CPDR Oral Take 40 mg by mouth daily before  breakfast.    . NEBIVOLOL HCL 5 MG PO TABS Oral Take 5 mg by mouth daily.      BP 139/88  Pulse 74  Temp 98.8 F (37.1 C) (Oral)  Resp 16  SpO2 95%  Physical Exam  Nursing note and vitals reviewed. Constitutional: He is oriented to person, place, and time. He appears well-developed and well-nourished.  HENT:  Head: Normocephalic and atraumatic.  Eyes: EOM are normal. Pupils are equal, round, and reactive to light.  Neck: Normal range of motion. Neck supple.  Cardiovascular: Normal rate, regular rhythm and normal heart sounds.   No murmur heard. Pulmonary/Chest: Effort normal and breath sounds normal.  Abdominal: Soft. Bowel sounds are normal. He exhibits no mass. There is tenderness. There is no rebound and no guarding.       Mild tenderness. No rebound or guarding.  Musculoskeletal: Normal range of motion. He exhibits no edema.  Neurological: He is alert and oriented to person, place, and time. No cranial nerve deficit.  Skin: Skin is warm and dry.  Psychiatric: He has a normal mood and affect.    ED Course  Procedures (including critical care time)  Labs Reviewed  POCT I-STAT, CHEM 8 - Abnormal; Notable for the following:    Glucose, Bld 105 (*)     All other components within normal limits  CBC WITH DIFFERENTIAL   Ct Abdomen Pelvis W Contrast  12/10/2011  *RADIOLOGY REPORT*  Clinical Data: Abdominal pain and vomiting.  Small bowel obstruction.  CT ABDOMEN AND PELVIS WITH CONTRAST  Technique:  Multidetector  CT imaging of the abdomen and pelvis was performed following the standard protocol during bolus administration of intravenous contrast.  Contrast: OMNIPAQUE IOHEXOL 300 MG/ML  SOLN  Comparison: 11/08/2009  Findings: Multiple moderately dilated small bowel loops seen containing air fluid levels. The distal small bowel is nondilated although a discrete transition point is difficult to identified. This is consistent with a partial small bowel obstruction.  There is no  evidence of inflammatory process, bowel wall thickening, or pneumatosis.  Shotty mesenteric lymph nodes are again seen, without significant change.  No other soft tissue masses or lymphadenopathy identified.  Bilateral renal parapelvic cyst noted but no evidence of renal mass or hydronephrosis. Surgical clips again seen from prior cholecystectomy.  The other abdominal parenchymal organs are unremarkable in appearance.  No evidence of hernia.  IMPRESSION:  1.  Findings consistent with partial small bowel obstruction. 2.  No evidence of mass or inflammatory process.   Original Report Authenticated By: Danae Orleans, M.D.    Dg Abd Acute W/chest  12/10/2011  *RADIOLOGY REPORT*  Clinical Data: Abdominal pain.  Vomiting.  History of bowel obstruction.  ACUTE ABDOMEN SERIES (ABDOMEN 2 VIEW & CHEST 1 VIEW)  Comparison: 11/09/2009  Findings: Multiple dilated small bowel loops seen containing air fluid levels, and there is a paucity of colonic gas.  This is consistent with a mid to distal small bowel obstruction.  There is no evidence of free intraperitoneal air.  Bibasilar scarring is again seen, right side greater than left.  No evidence of acute infiltrate or edema.  No evidence of pleural effusion.  Heart size is normal.  IMPRESSION:  1.  Findings consistent with mid to distal small bowel obstruction. 2.  Stable bibasilar scarring.  No active lung disease.   Original Report Authenticated By: Danae Orleans, M.D.      1. Partial small bowel obstruction       MDM  Patient comes in after being sent in by his primary care Dr. with a possible small bowel structure on x-ray. He is a previous history of the same. He is tolerating orals and has not vomited in 2 days. He has not passed stools in 2 days, however he is still passing gas. Is not distended as tolerated precontrast here. He has a benign abdomen. After discussion with Dr. Carolynne Edouard patient will followup in the surgical clinic tomorrow. He'll return if he begins  to vomit or has increased pain. He'll be on a clear liquid diet.        Juliet Rude. Rubin Payor, MD 12/10/11 2051

## 2011-12-10 NOTE — ED Notes (Addendum)
Ct at bedside to transport pt. Pt stable and ready for transport

## 2011-12-11 ENCOUNTER — Telehealth (INDEPENDENT_AMBULATORY_CARE_PROVIDER_SITE_OTHER): Payer: Self-pay | Admitting: General Surgery

## 2011-12-11 NOTE — Telephone Encounter (Signed)
Patient called in stating he was told at hospital to follow up with Dr. Derrell Lolling. Patient was in the hospital yesterday for possible small bowel obstruction and was sent home. Patient stated he feels it has past because he is able to have a bowel movement and passing gas. Patient stated he has had this problem before and thinks it is ok right now. Patient said he was confused as to weather or not he was supposed to see Dr. Derrell Lolling to just follow up or not. Advise patient this would be discussed with Dr. Derrell Lolling and I would get back to him with an answer. Call back # [phone] (home) [phone] (cell).

## 2011-12-11 NOTE — Telephone Encounter (Signed)
Called patient back per our conversation this morning and advised that Dr. Derrell Lolling reviewed his x-ray and labs and there is nothing to indicate surgical intervention is required and no follow up with him was needed at this time. Dr. Derrell Lolling did request he would like a copy of his H&P/office notes from Dr. Sherwood Gambler. Dr. Derrell Lolling also advised for the patient to follow up with his GI doctor as well. The patient stated when he last went to the hospital for this problem it was two years ago and he had the same result when he was evaluated, no surgery was performed at that time. He stated he would call Dr. Sherwood Gambler directly to request the information be sent to Dr. Jacinto Halim attention. I advised the patient to call as it would make more sense than driving to our office to sign a paper and then fax it. The patient agreed.

## 2012-07-03 ENCOUNTER — Other Ambulatory Visit (HOSPITAL_COMMUNITY): Payer: Self-pay | Admitting: Family Medicine

## 2012-07-03 ENCOUNTER — Ambulatory Visit (HOSPITAL_COMMUNITY)
Admission: RE | Admit: 2012-07-03 | Discharge: 2012-07-03 | Disposition: A | Discharge status: Home / Self Care | Payer: PRIVATE HEALTH INSURANCE | Source: Ambulatory Visit | Attending: Family Medicine | Admitting: Family Medicine

## 2012-07-03 DIAGNOSIS — R109 Unspecified abdominal pain: Secondary | ICD-10-CM

## 2012-07-03 DIAGNOSIS — R933 Abnormal findings on diagnostic imaging of other parts of digestive tract: Secondary | ICD-10-CM | POA: Insufficient documentation

## 2012-07-04 ENCOUNTER — Telehealth (INDEPENDENT_AMBULATORY_CARE_PROVIDER_SITE_OTHER): Payer: Self-pay

## 2012-07-04 ENCOUNTER — Other Ambulatory Visit (INDEPENDENT_AMBULATORY_CARE_PROVIDER_SITE_OTHER): Payer: Self-pay

## 2012-07-04 DIAGNOSIS — K56609 Unspecified intestinal obstruction, unspecified as to partial versus complete obstruction: Secondary | ICD-10-CM

## 2012-07-04 NOTE — Telephone Encounter (Signed)
I notified the pt Dr Derrell Lolling reviewed his xray.  He wants to review his old chart.  He said as long as he is tolerating liquids, having no pain today, no bloating and passing stool even if liquid he doesn't need to be seen today.  He can stay on liquids today and advance to regular diet tomorrow.  I will have Dr Derrell Lolling review his old chart this pm and call him back about seeing him next week.

## 2012-07-04 NOTE — Telephone Encounter (Signed)
The pt called to report he has a partial bowel obstruction.  He was seen at [facility] yesterday.  He hasn't eaten solid food since last Tuesday.  He woke up Wednesday am with pain.  For a couple days he didn't pass gas.  He is only passing liquid stool.  He at times feels pressure and bloating.  He hasn't vomited.  Today he feels ok without pain.  He was instructed to call his surgeon and make an appointment.  He said he talked to Dr Daphine Deutscher yesterday and he said as long he is passing gas and bowel movements he isn't obstructed.  The pt wants to see Dr Derrell Lolling.  I reviewed his old notes and ct and saw where Dr Derrell Lolling advised there is no surgical indication.  I will have Dr Derrell Lolling review the current xray and advise.

## 2012-07-05 ENCOUNTER — Telehealth (INDEPENDENT_AMBULATORY_CARE_PROVIDER_SITE_OTHER): Payer: Self-pay | Admitting: General Surgery

## 2012-07-05 NOTE — Telephone Encounter (Signed)
Spoke with patient he is aware of test at [facility] 07/06/12 10am  Instructed to have no solids 4 hours prior but he can take meds and drink liquids

## 2012-07-06 ENCOUNTER — Telehealth (INDEPENDENT_AMBULATORY_CARE_PROVIDER_SITE_OTHER): Payer: Self-pay

## 2012-07-06 ENCOUNTER — Ambulatory Visit (HOSPITAL_COMMUNITY)
Admission: RE | Admit: 2012-07-06 | Discharge: 2012-07-06 | Disposition: A | Discharge status: Home / Self Care | Payer: PRIVATE HEALTH INSURANCE | Source: Ambulatory Visit | Attending: General Surgery | Admitting: General Surgery

## 2012-07-06 DIAGNOSIS — K56609 Unspecified intestinal obstruction, unspecified as to partial versus complete obstruction: Secondary | ICD-10-CM | POA: Insufficient documentation

## 2012-07-06 DIAGNOSIS — K7689 Other specified diseases of liver: Secondary | ICD-10-CM | POA: Insufficient documentation

## 2012-07-06 MED ORDER — IOHEXOL 300 MG/ML  SOLN
100.0000 mL | Freq: Once | INTRAMUSCULAR | Status: AC | PRN
  Administered 2012-07-06: 100 mL via INTRAVENOUS

## 2012-07-06 NOTE — Telephone Encounter (Signed)
Message copied by Ivory Broad on Wed Jul 06, 2012  4:42 PM ------      Message from: Ernestene Mention      Created: Wed Jul 06, 2012  4:36 PM       Call radiology reports to patient.No evidence of obstruction, inflammation, or cancer. This is good news. Will discuss with him further. ------

## 2012-07-06 NOTE — Telephone Encounter (Signed)
I called the pt and notified him of the xray results.  He asked about a note for returning to work Monday.  I told him we can't do that since we haven't seen him.  He will get it from his medical md.

## 2012-07-12 ENCOUNTER — Encounter (INDEPENDENT_AMBULATORY_CARE_PROVIDER_SITE_OTHER): Payer: Self-pay | Admitting: General Surgery

## 2012-07-12 ENCOUNTER — Ambulatory Visit (INDEPENDENT_AMBULATORY_CARE_PROVIDER_SITE_OTHER): Payer: PRIVATE HEALTH INSURANCE | Admitting: General Surgery

## 2012-07-12 VITALS — BP 132/82 | HR 56 | Temp 96.8°F | Resp 18 | Ht 72.0 in | Wt 221.8 lb

## 2012-07-12 DIAGNOSIS — R109 Unspecified abdominal pain: Secondary | ICD-10-CM

## 2012-07-12 NOTE — Progress Notes (Signed)
Patient ID: Justin [name], male   DOB: 1950-05-28, 62 y.o.   MRN: [id number]  Chief Complaint  Patient presents with  . New Evaluation    eval recurrent sbo    HPI Justin [name] is a 62 y.o. male.  He is referred back to me by Dr. Regino Schultze in [place] for evaluation of transient abdominal pain, possible bowel obstruction. The patient is asymptomatic today.  I initially  evaluated this patient in 2009 after an episode of abdominal pain and plain films suggested a partial small bowel obstruction. This resolved quickly. A CT enteroclysis study did not look all that bad.  He has a history of exploratory laparotomy for bowel obstruction at [facility] in 1997. After the surgery, he states a surgeon told him there was no blockage and that he might have had a couple of small adhesions but there was no reason surgically to explain his symptoms. He has chronic reflux symptoms. He is due for a colonoscopy. He had an open cholecystectomy in 2004.  In October 2013 he went to the emergency department and then was sent home for abdominal pain. CT scan October 2013 which showed some air fluid levels but no discrete transition.  About 10 days ago he had some abdominal discomfort. It wasn't really hard pain he just felt tight and anorexic. Valve was slid down for a few days and then have now normalized. I did look at his plain films and they did show some air-fluid levels. A CT enteroclysis study was done last week and this is completely normal. No radiographic evidence of inflammatory bowel disease or bowel obstruction. No other acute findings. No mass or adenopathy.  He is asymptomatic today and is here for discussion  HPI  Past Medical History  Diagnosis Date  . Small bowel obstruction   . Acid reflux   . Hypertension     Past Surgical History  Procedure Laterality Date  . Knee surgery  1980's.  . Cholecystectomy  2003    Family History  Problem Relation Age of Onset  . Cancer Sister     Breast    Social History History  Substance Use Topics  . Smoking status: Never Smoker   . Smokeless tobacco: Former Neurosurgeon    Types: Chew    Quit date: 12/01/2011  . Alcohol Use: No    No Known Allergies  Current Outpatient Prescriptions  Medication Sig Dispense Refill  . ALPRAZolam (XANAX) 1 MG tablet       . esomeprazole (NEXIUM) 40 MG capsule Take 40 mg by mouth daily before breakfast.      . HYDROcodone-acetaminophen (NORCO/VICODIN) 5-325 MG per tablet       . indomethacin (INDOCIN) 50 MG capsule       . nebivolol (BYSTOLIC) 5 MG tablet Take 5 mg by mouth daily.       No current facility-administered medications for this visit.    Review of Systems Review of Systems  Constitutional: Negative for fever, chills and unexpected weight change.  HENT: Negative for hearing loss, congestion, sore throat, trouble swallowing and voice change.   Eyes: Negative for visual disturbance.  Respiratory: Negative for cough and wheezing.   Cardiovascular: Negative for chest pain, palpitations and leg swelling.  Gastrointestinal: Positive for abdominal pain and constipation. Negative for nausea, vomiting, diarrhea, blood in stool, abdominal distention, anal bleeding and rectal pain.  Genitourinary: Negative for hematuria and difficulty urinating.  Musculoskeletal: Negative for arthralgias.  Skin: Negative for rash and wound.  Neurological: Negative for seizures, syncope, weakness and headaches.  Hematological: Negative for adenopathy. Does not bruise/bleed easily.  Psychiatric/Behavioral: Negative for confusion.    Blood pressure 132/82, pulse 56, temperature 96.8 F (36 C), temperature source Temporal, resp. rate 18, height 6' (1.829 m), weight 221 lb 12.8 oz (100.608 kg).  Physical Exam Physical Exam  Constitutional: He is oriented to person, place, and time. He appears well-developed and well-nourished. No distress.  HENT:  Head: Normocephalic.  Nose: Nose normal.  Mouth/Throat:  No oropharyngeal exudate.  Eyes: Conjunctivae and EOM are normal. Pupils are equal, round, and reactive to light. Right eye exhibits no discharge. Left eye exhibits no discharge. No scleral icterus.  Neck: Normal range of motion. Neck supple. No JVD present. No tracheal deviation present. No thyromegaly present.  Cardiovascular: Normal rate, regular rhythm, normal heart sounds and intact distal pulses.   No murmur heard. Pulmonary/Chest: Effort normal and breath sounds normal. No stridor. No respiratory distress. He has no wheezes. He has no rales. He exhibits no tenderness.  Abdominal: Soft. Bowel sounds are normal. He exhibits no distension and no mass. There is no tenderness. There is no rebound and no guarding.  Soft, nontender, nondistended. Midline scar well healed. Right subcostal scar well healed. No abdominal wall hernias. No inguinal or femoral hernias.  Musculoskeletal: Normal range of motion. He exhibits no edema and no tenderness.  Lymphadenopathy:    He has no cervical adenopathy.  Neurological: He is alert and oriented to person, place, and time. He has normal reflexes. Coordination normal.  Skin: Skin is warm and dry. No rash noted. He is not diaphoretic. No erythema. No pallor.  Psychiatric: He has a normal mood and affect. His behavior is normal. Judgment and thought content normal.    Data Reviewed Abdominal x-rays and recent CT enteroclysis study  Assessment    Transient abdominal pain and small bowel dilatation. This may be a low-grade partial small bowel obstruction or simply a motility disorder. I do not think there is enough going on to warrant laparoscopic or open exploration.  Past history open cholecystectomy  Past history of negative laparotomy for abdominal pain and possible bowel obstruction  GERD     Plan    Long discussion. He does not want to have surgery unless it is likely to help him. He feels fine now.  He is referred by Dr. Jena Gauss for screening  colonoscopy  Return to see [place] if symptoms recur. I told him that surgical decisions will be made on individual basis based on the history and physical and radiographic findings at the time.        Angelia Mould. Derrell Lolling, M.D., [facility] General and Minimally invasive Surgery Breast and Colorectal Surgery Office:   [phone] Pager:   [phone] ' 07/12/2012, 3:49 PM

## 2012-07-12 NOTE — Patient Instructions (Signed)
Your episode of abdominal pain and the abnormal x-ray suggests more than one possibility. You might have a partial, low-grade small bowel obstruction which resolved quickly. You might have some other motility disorder Which would not be helped by surgery.  Your CT enteroclysis study is completely normal. There is no inflammation or blockage. There is no hernia either internally or on the  abdominal wall. There is no sign of cancer.  Since you are completely asymptomatic now, and we are uncertain of the diagnosis, I do not advise surgery.  I agree with colonoscopy this year. Please go ahead and schedule that as you have indicated.  You are welcome to return to see [place] if further problems arise.

## 2012-07-14 ENCOUNTER — Encounter: Payer: Self-pay | Admitting: Internal Medicine

## 2012-08-01 ENCOUNTER — Ambulatory Visit (INDEPENDENT_AMBULATORY_CARE_PROVIDER_SITE_OTHER): Payer: PRIVATE HEALTH INSURANCE | Admitting: Gastroenterology

## 2012-08-01 ENCOUNTER — Encounter: Payer: Self-pay | Admitting: Gastroenterology

## 2012-08-01 VITALS — Temp 97.8°F | Ht 72.0 in | Wt 218.8 lb

## 2012-08-01 DIAGNOSIS — Z1211 Encounter for screening for malignant neoplasm of colon: Secondary | ICD-10-CM

## 2012-08-01 DIAGNOSIS — K7689 Other specified diseases of liver: Secondary | ICD-10-CM

## 2012-08-01 DIAGNOSIS — K760 Fatty (change of) liver, not elsewhere classified: Secondary | ICD-10-CM

## 2012-08-01 MED ORDER — PEG 3350-KCL-NA BICARB-NACL 420 G PO SOLR: 4000.0000 mL | ORAL | Status: DC

## 2012-08-01 NOTE — Patient Instructions (Addendum)
We have scheduled you for a colonoscopy with Dr. Rourk in the near future.  Further recommendations to follow!   

## 2012-08-01 NOTE — Progress Notes (Signed)
Referring Provider: Elfredia Nevins, MD Primary Care Physician:  Cassell Smiles., MD Primary Gastroenterologist:  Dr. Jena Gauss   Chief Complaint  Patient presents with  . Colonoscopy    HPI:   Justin [name] is a pleasant 62 year old male presenting today for a screening colonoscopy. He was recently seen by Dr. Claud Kelp with [facility] due to transient abdominal pain and small bowel dilatation. It was thought he may have had a partial, low-grade small bowel obstruction with resolution; unable to rule out a motility disorder. CT enterography was without evidence of IBD or bowel obstruction; mild fatty liver noted. Exploratory surgery was not felt to be beneficial. Dr. Mikey Bussing has referred him to Dr. Jena Gauss for further lower GI evaluation.   He has a history of IBS and GERD. No significant changes in bowel habits. No rectal bleeding. GERD symptoms controlled with Nexium. Rarely has some breakthrough and will take a teaspoon of vinegar with great results. Only twice in the last year. No dysphagia. States weight fluctuates with a high of 228, loses weight in the summer.   Past Medical History  Diagnosis Date  . Small bowel obstruction   . Acid reflux   . Hypertension   . IBS (irritable bowel syndrome)     Past Surgical History  Procedure Laterality Date  . Knee surgery  1980's.    bilateral  . Cholecystectomy  2003  . Colonoscopy  June 2004    Dr. Jena Gauss: normal  . Exploratory laparotomy      remote past  . Appendectomy      Current Outpatient Prescriptions  Medication Sig Dispense Refill  . ALPRAZolam (XANAX) 1 MG tablet Take 1 mg by mouth at bedtime as needed.       Marland Kitchen BYSTOLIC 10 MG tablet Take 10 mg by mouth daily.       Marland Kitchen esomeprazole (NEXIUM) 40 MG capsule Take 40 mg by mouth daily before breakfast.      . indomethacin (INDOCIN) 50 MG capsule Take 50 mg by mouth.       . polyethylene glycol-electrolytes (TRILYTE) 420 G solution Take 4,000 mLs by mouth as  directed.  4000 mL  0   No current facility-administered medications for this visit.    Allergies as of 08/01/2012  . (No Known Allergies)    Family History  Problem Relation Age of Onset  . Cancer Sister     Breast  . Colon cancer Neg Hx     History   Social History  . Marital Status: Married    Spouse Name: N/A    Number of Children: N/A  . Years of Education: N/A   Occupational History  . maintenance manager     over 7 plants in [place]   Social History Main Topics  . Smoking status: Never Smoker   . Smokeless tobacco: Former Neurosurgeon    Types: Chew    Quit date: 12/01/2011  . Alcohol Use: No  . Drug Use: No  . Sexually Active: Not on file   Other Topics Concern  . Not on file   Social History Narrative  . No narrative on file    Review of Systems: Gen: SEE HPI CV: Denies chest pain, heart palpitations, syncope, peripheral edema. Resp: Denies shortness of breath with rest, cough, wheezing GI: SEE HPI GU : +urinary frequency MS: Denies joint pain, muscle weakness, cramps, limited movement Derm: Denies rash, itching, dry skin Psych: Denies depression, anxiety, confusion or memory loss  Heme: Denies bruising, bleeding, and enlarged lymph nodes.  Physical Exam: Temp(Src) 97.8 F (36.6 C) (Oral)  Ht 6' (1.829 m)  Wt 218 lb 12.8 oz (99.247 kg)  BMI 29.67 kg/m2 General:   Alert and oriented. Well-developed, well-nourished, pleasant and cooperative. Head:  Normocephalic and atraumatic. Eyes:  Conjunctiva pink, sclera clear, no icterus.   Conjunctiva pink. Ears:  Normal auditory acuity. Nose:  No deformity, discharge,  or lesions. Mouth:  No deformity or lesions, mucosa pink and moist.  Neck:  Supple, without mass or thyromegaly. Lungs:  Clear to auscultation bilaterally, without wheezing, rales, or rhonchi.  Heart:  S1, S2 present without murmurs noted.  Abdomen:  +BS, soft, non-tender and non-distended. Without mass or HSM. No  rebound or guarding. No hernias noted. Rectal:  Deferred  Msk:  Symmetrical without gross deformities. Normal posture. Extremities:  Without clubbing or edema. Neurologic:  Alert and  oriented x4;  grossly normal neurologically. Skin:  Intact, warm and dry without significant lesions or rashes Cervical Nodes:  No significant cervical adenopathy. Psych:  Alert and cooperative. Normal mood and affect.  Outside labs reviewed from Oct 2013: LFTs normal Hgb 15.9 Plts 181

## 2012-08-02 NOTE — Progress Notes (Signed)
Cc PCP 

## 2012-08-02 NOTE — Assessment & Plan Note (Signed)
62 year old male with need for routine screening colonoscopy. He has no lower or upper GI symptoms currently. Interestingly, he has had bouts of small bowel obstructions over the years and was seen by Dr. Derrell Lolling for further evaluation. Surgery not indicated at this time, and CT enterography was negative. 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   Proceed with TCS with Dr. Jena Gauss in near future: the risks, benefits, and alternatives have been discussed with the patient in detail. The patient states understanding and desires to proceed.

## 2012-08-02 NOTE — Assessment & Plan Note (Signed)
Discussed importance of low-fat diet, exercise. LFTs from Oct 2013 by PCP were normal. Recheck in 1 year

## 2012-08-09 LAB — COMPREHENSIVE METABOLIC PANEL
Alkaline Phosphatase: 51 U/L
Glucose: 95
Sodium: 139 mmol/L (ref 137–147)
Total Bilirubin: 1 mg/dL
Total Protein: 6.5 g/dL

## 2012-08-09 LAB — CBC: HCT: 44 %

## 2012-08-12 ENCOUNTER — Encounter (HOSPITAL_COMMUNITY): Payer: Self-pay | Admitting: Pharmacy Technician

## 2012-08-31 ENCOUNTER — Ambulatory Visit (HOSPITAL_COMMUNITY)
Admission: RE | Admit: 2012-08-31 | Discharge: 2012-08-31 | Disposition: A | Discharge status: Home / Self Care | Payer: PRIVATE HEALTH INSURANCE | Source: Ambulatory Visit | Attending: Internal Medicine | Admitting: Internal Medicine

## 2012-08-31 ENCOUNTER — Encounter (HOSPITAL_COMMUNITY): Payer: Self-pay | Admitting: *Deleted

## 2012-08-31 ENCOUNTER — Encounter (HOSPITAL_COMMUNITY)
Admission: RE | Disposition: A | Discharge status: Home / Self Care | Payer: Self-pay | Source: Ambulatory Visit | Attending: Internal Medicine

## 2012-08-31 DIAGNOSIS — K648 Other hemorrhoids: Secondary | ICD-10-CM | POA: Insufficient documentation

## 2012-08-31 DIAGNOSIS — Z1211 Encounter for screening for malignant neoplasm of colon: Secondary | ICD-10-CM

## 2012-08-31 DIAGNOSIS — D126 Benign neoplasm of colon, unspecified: Secondary | ICD-10-CM

## 2012-08-31 DIAGNOSIS — I10 Essential (primary) hypertension: Secondary | ICD-10-CM | POA: Insufficient documentation

## 2012-08-31 HISTORY — PX: COLONOSCOPY: SHX5424

## 2012-08-31 SURGERY — COLONOSCOPY
Anesthesia: Moderate Sedation

## 2012-08-31 MED ORDER — MEPERIDINE HCL 100 MG/ML IJ SOLN
INTRAMUSCULAR | Status: AC
  Filled 2012-08-31: qty 2

## 2012-08-31 MED ORDER — ONDANSETRON HCL 4 MG/2ML IJ SOLN
INTRAMUSCULAR | Status: DC | PRN
  Administered 2012-08-31: 4 mg via INTRAVENOUS

## 2012-08-31 MED ORDER — MIDAZOLAM HCL 5 MG/5ML IJ SOLN
INTRAMUSCULAR | Status: AC
  Filled 2012-08-31: qty 10

## 2012-08-31 MED ORDER — STERILE WATER FOR IRRIGATION IR SOLN
Status: DC | PRN
  Administered 2012-08-31: 08:00:00

## 2012-08-31 MED ORDER — SODIUM CHLORIDE 0.9 % IV SOLN
INTRAVENOUS | Status: DC
  Administered 2012-08-31: 1000 mL via INTRAVENOUS

## 2012-08-31 MED ORDER — MEPERIDINE HCL 100 MG/ML IJ SOLN
INTRAMUSCULAR | Status: DC | PRN
  Administered 2012-08-31 (×2): 50 mg via INTRAVENOUS
  Administered 2012-08-31: 25 mg via INTRAVENOUS

## 2012-08-31 MED ORDER — MIDAZOLAM HCL 5 MG/5ML IJ SOLN
INTRAMUSCULAR | Status: DC | PRN
  Administered 2012-08-31 (×2): 1 mg via INTRAVENOUS
  Administered 2012-08-31: 2 mg via INTRAVENOUS
  Administered 2012-08-31 (×2): 1 mg via INTRAVENOUS
  Administered 2012-08-31: 2 mg via INTRAVENOUS

## 2012-08-31 MED ORDER — MIDAZOLAM HCL 2 MG/2ML IJ SOLN
INTRAMUSCULAR | Status: AC
  Filled 2012-08-31: qty 4

## 2012-08-31 MED ORDER — ONDANSETRON HCL 4 MG/2ML IJ SOLN
INTRAMUSCULAR | Status: AC
  Filled 2012-08-31: qty 2

## 2012-08-31 NOTE — Interval H&P Note (Signed)
History and Physical Interval Note:  08/31/2012 8:36 AM  Justin [name]  has presented today for surgery, with the diagnosis of SCREENING  The various methods of treatment have been discussed with the patient and family. After consideration of risks, benefits and other options for treatment, the patient has consented to  Procedure(s) with comments: COLONOSCOPY (N/A) - 8:30 as a surgical intervention .  The patient's history has been reviewed, patient examined, no change in status, stable for surgery.  I have reviewed the patient's chart and labs.  Questions were answered to the patient's satisfaction.     Eula Listen  Colonoscopy today per plan.The risks, benefits, limitations, alternatives and imponderables have been reviewed with the patient. Questions have been answered. All parties are agreeable.

## 2012-08-31 NOTE — Op Note (Signed)
[facility] [address] [place], [address]   COLONOSCOPY PROCEDURE REPORT  PATIENT: [name], Rise  MR#:         [id number] BIRTHDATE: Sep 02, 1950 , 62  yrs. old GENDER: Male ENDOSCOPIST: R.  Roetta Sessions, MD FACP FACG REFERRED BY:  Karleen Hampshire, M.D.  Claud Kelp, M.D. PROCEDURE DATE:  08/31/2012 PROCEDURE:     Ileocolonoscopy with snare polypectomy  INDICATIONS: Average risk colorectal cancer screening examination  INFORMED CONSENT:  The risks, benefits, alternatives and imponderables including but not limited to bleeding, perforation as well as the possibility of a missed lesion have been reviewed.  The potential for biopsy, lesion removal, etc. have also been discussed.  Questions have been answered.  All parties agreeable. Please see the history and physical in the medical record for more information.  MEDICATIONS: Versed 8 mg IV and Demerol 125 mg IV in divided doses. Zofran 4 mg IV.  DESCRIPTION OF PROCEDURE:  After a digital rectal exam was performed, the EC-[id number] ([id number])  colonoscope was advanced from the anus through the rectum and colon to the area of the cecum, ileocecal valve and appendiceal orifice.  The cecum was deeply intubated.  These structures were well-seen and photographed for the record.  From the level of the cecum and ileocecal valve, the scope was slowly and cautiously withdrawn.  The mucosal surfaces were carefully surveyed utilizing scope tip deflection to facilitate fold flattening as needed.  The scope was pulled down into the rectum where a thorough examination including retroflexion was performed.    FINDINGS:  Adequate preparation. Minimal internal hemorrhoids; otherwise, normal appearing rectum. (2) 6 mm flat polyps at the hepatic flexure. The left colon was somewhat "stiff"; otherwise, the remainder of colonic mucosa appeared normal. The distal 10 cm of terminal ileal mucosa appeared normal.  THERAPEUTIC  / DIAGNOSTIC MANEUVERS PERFORMED:  The above-mentioned polyps were completely removed with hot snare cautery technique.  COMPLICATIONS: None  CECAL WITHDRAWAL TIME:  17 minutes  IMPRESSION:  Colonic polyps-removed as described above  RECOMMENDATIONS: followup on pathology.   _______________________________ eSigned:  R. Roetta Sessions, MD FACP [facility] 08/31/2012 9:29 AM   CC:    PATIENT NAME:  Justin [name], Justin [name] MR#: [id number]

## 2012-08-31 NOTE — H&P (View-Only) (Signed)
Referring Provider: Elfredia Nevins, MD Primary Care Physician:  Cassell Smiles., MD Primary Gastroenterologist:  Dr. Jena Gauss   Chief Complaint  Patient presents with  . Colonoscopy    HPI:   Mr. Justin [name] is a pleasant 62 year old male presenting today for a screening colonoscopy. He was recently seen by Dr. Claud Kelp with [facility] due to transient abdominal pain and small bowel dilatation. It was thought he may have had a partial, low-grade small bowel obstruction with resolution; unable to rule out a motility disorder. CT enterography was without evidence of IBD or bowel obstruction; mild fatty liver noted. Exploratory surgery was not felt to be beneficial. Dr. Mikey Bussing has referred him to Dr. Jena Gauss for further lower GI evaluation.   He has a history of IBS and GERD. No significant changes in bowel habits. No rectal bleeding. GERD symptoms controlled with Nexium. Rarely has some breakthrough and will take a teaspoon of vinegar with great results. Only twice in the last year. No dysphagia. States weight fluctuates with a high of 228, loses weight in the summer.   Past Medical History  Diagnosis Date  . Small bowel obstruction   . Acid reflux   . Hypertension   . IBS (irritable bowel syndrome)     Past Surgical History  Procedure Laterality Date  . Knee surgery  1980's.    bilateral  . Cholecystectomy  2003  . Colonoscopy  June 2004    Dr. Jena Gauss: normal  . Exploratory laparotomy      remote past  . Appendectomy      Current Outpatient Prescriptions  Medication Sig Dispense Refill  . ALPRAZolam (XANAX) 1 MG tablet Take 1 mg by mouth at bedtime as needed.       Marland Kitchen BYSTOLIC 10 MG tablet Take 10 mg by mouth daily.       Marland Kitchen esomeprazole (NEXIUM) 40 MG capsule Take 40 mg by mouth daily before breakfast.      . indomethacin (INDOCIN) 50 MG capsule Take 50 mg by mouth.       . polyethylene glycol-electrolytes (TRILYTE) 420 G solution Take 4,000 mLs by mouth as  directed.  4000 mL  0   No current facility-administered medications for this visit.    Allergies as of 08/01/2012  . (No Known Allergies)    Family History  Problem Relation Age of Onset  . Cancer Sister     Breast  . Colon cancer Neg Hx     History   Social History  . Marital Status: Married    Spouse Name: N/A    Number of Children: N/A  . Years of Education: N/A   Occupational History  . maintenance manager     over 7 plants in [place]   Social History Main Topics  . Smoking status: Never Smoker   . Smokeless tobacco: Former Neurosurgeon    Types: Chew    Quit date: 12/01/2011  . Alcohol Use: No  . Drug Use: No  . Sexually Active: Not on file   Other Topics Concern  . Not on file   Social History Narrative  . No narrative on file    Review of Systems: Gen: SEE HPI CV: Denies chest pain, heart palpitations, syncope, peripheral edema. Resp: Denies shortness of breath with rest, cough, wheezing GI: SEE HPI GU : +urinary frequency MS: Denies joint pain, muscle weakness, cramps, limited movement Derm: Denies rash, itching, dry skin Psych: Denies depression, anxiety, confusion or memory loss  Heme: Denies bruising, bleeding, and enlarged lymph nodes.  Physical Exam: Temp(Src) 97.8 F (36.6 C) (Oral)  Ht 6' (1.829 m)  Wt 218 lb 12.8 oz (99.247 kg)  BMI 29.67 kg/m2 General:   Alert and oriented. Well-developed, well-nourished, pleasant and cooperative. Head:  Normocephalic and atraumatic. Eyes:  Conjunctiva pink, sclera clear, no icterus.   Conjunctiva pink. Ears:  Normal auditory acuity. Nose:  No deformity, discharge,  or lesions. Mouth:  No deformity or lesions, mucosa pink and moist.  Neck:  Supple, without mass or thyromegaly. Lungs:  Clear to auscultation bilaterally, without wheezing, rales, or rhonchi.  Heart:  S1, S2 present without murmurs noted.  Abdomen:  +BS, soft, non-tender and non-distended. Without mass or HSM. No  rebound or guarding. No hernias noted. Rectal:  Deferred  Msk:  Symmetrical without gross deformities. Normal posture. Extremities:  Without clubbing or edema. Neurologic:  Alert and  oriented x4;  grossly normal neurologically. Skin:  Intact, warm and dry without significant lesions or rashes Cervical Nodes:  No significant cervical adenopathy. Psych:  Alert and cooperative. Normal mood and affect.  Outside labs reviewed from Oct 2013: LFTs normal Hgb 15.9 Plts 181

## 2012-09-05 ENCOUNTER — Encounter (HOSPITAL_COMMUNITY): Payer: Self-pay | Admitting: Internal Medicine

## 2012-09-09 ENCOUNTER — Encounter: Payer: Self-pay | Admitting: Internal Medicine

## 2012-11-14 ENCOUNTER — Other Ambulatory Visit (HOSPITAL_COMMUNITY): Payer: Self-pay | Admitting: Family Medicine

## 2012-11-14 DIAGNOSIS — R141 Gas pain: Secondary | ICD-10-CM

## 2012-11-15 ENCOUNTER — Ambulatory Visit (HOSPITAL_COMMUNITY)
Admission: RE | Admit: 2012-11-15 | Discharge: 2012-11-15 | Disposition: A | Discharge status: Home / Self Care | Payer: PRIVATE HEALTH INSURANCE | Source: Ambulatory Visit | Attending: Family Medicine | Admitting: Family Medicine

## 2012-11-15 DIAGNOSIS — R141 Gas pain: Secondary | ICD-10-CM

## 2012-11-15 DIAGNOSIS — R109 Unspecified abdominal pain: Secondary | ICD-10-CM | POA: Insufficient documentation

## 2012-12-17 ENCOUNTER — Emergency Department (HOSPITAL_COMMUNITY): Payer: PRIVATE HEALTH INSURANCE

## 2012-12-17 ENCOUNTER — Encounter (HOSPITAL_COMMUNITY): Payer: Self-pay | Admitting: Emergency Medicine

## 2012-12-17 ENCOUNTER — Emergency Department (HOSPITAL_COMMUNITY)
Admission: EM | Admit: 2012-12-17 | Discharge: 2012-12-17 | Disposition: A | Discharge status: Home / Self Care | Payer: PRIVATE HEALTH INSURANCE | Attending: Emergency Medicine | Admitting: Emergency Medicine

## 2012-12-17 DIAGNOSIS — W2209XA Striking against other stationary object, initial encounter: Secondary | ICD-10-CM | POA: Insufficient documentation

## 2012-12-17 DIAGNOSIS — K219 Gastro-esophageal reflux disease without esophagitis: Secondary | ICD-10-CM | POA: Insufficient documentation

## 2012-12-17 DIAGNOSIS — S5010XA Contusion of unspecified forearm, initial encounter: Secondary | ICD-10-CM | POA: Insufficient documentation

## 2012-12-17 DIAGNOSIS — Y9389 Activity, other specified: Secondary | ICD-10-CM | POA: Insufficient documentation

## 2012-12-17 DIAGNOSIS — Z79899 Other long term (current) drug therapy: Secondary | ICD-10-CM | POA: Insufficient documentation

## 2012-12-17 DIAGNOSIS — S5011XA Contusion of right forearm, initial encounter: Secondary | ICD-10-CM

## 2012-12-17 DIAGNOSIS — Y929 Unspecified place or not applicable: Secondary | ICD-10-CM | POA: Insufficient documentation

## 2012-12-17 DIAGNOSIS — I10 Essential (primary) hypertension: Secondary | ICD-10-CM | POA: Insufficient documentation

## 2012-12-17 NOTE — ED Provider Notes (Signed)
CSN: [id number]     Arrival date & time 12/17/12  1505 History   First MD Initiated Contact with Patient 12/17/12 1527     Chief Complaint  Patient presents with  . Arm Pain   (Consider location/radiation/quality/duration/timing/severity/associated sxs/prior Treatment) HPI Comments: Justin [name] is a 62 y.o. Male presenting with pain and swelling of his right dorsal forearm which occurred when his forearm was struck by a wound of winch on his hay trailer.  He developed immediate swelling at the site of the injury which has improved since applying ice.  He denies weakness or numbness distal to the injury site and can make a fist without pain.  He does have pain with palpation and right wrist extension,  But states this is improving too since his arrival.  He has taken no medicines prior to arrival.  He denies other injury.     The history is provided by the patient.    Past Medical History  Diagnosis Date  . Small bowel obstruction   . Acid reflux   . Hypertension   . IBS (irritable bowel syndrome)    Past Surgical History  Procedure Laterality Date  . Knee surgery  1980's.    bilateral  . Cholecystectomy  2003  . Colonoscopy  June 2004    Dr. Jena Gauss: normal  . Exploratory laparotomy      remote past  . Appendectomy    . Colonoscopy N/A 08/31/2012    Procedure: COLONOSCOPY;  Surgeon: Corbin Ade, MD;  Location: [facility];  Service: Endoscopy;  Laterality: N/A;  8:30   Family History  Problem Relation Age of Onset  . Cancer Sister     Breast  . Colon cancer Neg Hx    History  Substance Use Topics  . Smoking status: Never Smoker   . Smokeless tobacco: Former Neurosurgeon    Types: Chew    Quit date: 12/01/2011  . Alcohol Use: No    Review of Systems  Constitutional: Negative for fever.  Musculoskeletal: Positive for arthralgias and joint swelling. Negative for myalgias.  Skin: Positive for color change. Negative for wound.  Neurological: Negative for weakness and  numbness.    Allergies  Review of patient's allergies indicates no known allergies.  Home Medications   Current Outpatient Rx  Name  Route  Sig  Dispense  Refill  . ALPRAZolam (XANAX) 1 MG tablet   Oral   Take 1 mg by mouth at bedtime as needed for sleep.          Marland Kitchen esomeprazole (NEXIUM) 40 MG capsule   Oral   Take 40 mg by mouth at bedtime.          . nebivolol (BYSTOLIC) 10 MG tablet   Oral   Take 10 mg by mouth daily.          BP 151/93  Pulse 65  Temp(Src) 97.8 F (36.6 C) (Oral)  Resp 19  SpO2 98% Physical Exam  Constitutional: He appears well-developed and well-nourished.  HENT:  Head: Atraumatic.  Neck: Normal range of motion.  Cardiovascular:  Pulses equal bilaterally  Musculoskeletal: He exhibits edema and tenderness.       Right forearm: He exhibits tenderness and edema. He exhibits no deformity and no laceration.  Hematoma right distal forearm,  Early bruise noted, skin intact.  Equal grip strength. No elbow pain.  Less than 3 sec cap refill.  Pain with wrist extension, less so with flexion.  Neurological: He is alert.  He has normal strength. He displays normal reflexes. No sensory deficit.  Equal strength  Skin: Skin is warm and dry.  Psychiatric: He has a normal mood and affect.    ED Course  Procedures (including critical care time) Labs Review Labs Reviewed - No data to display Imaging Review Dg Forearm Right  12/17/2012   CLINICAL DATA:  Pain post injury  EXAM: RIGHT FOREARM - 2 VIEW  COMPARISON:  None.  FINDINGS: Three views of right forearm submitted. No acute fracture or subluxation. There is negative ulnar variance.  IMPRESSION: No acute fracture or subluxation.   Electronically Signed   By: Natasha Mead M.D.   On: 12/17/2012 15:37   Dg Wrist Complete Right  12/17/2012   CLINICAL DATA:  Wrist pain post injury  EXAM: RIGHT WRIST - COMPLETE 3+ VIEW  COMPARISON:  Right forearm same day.  FINDINGS: Four views of the right wrist submitted. No  acute fracture or subluxation. There is negative ulnar variance. Soft tissue swelling noted dorsally distal radial region.  IMPRESSION: No acute fracture or subluxation. Dorsal soft tissue swelling distal radial region. Negative ulnar variance again noted.   Electronically Signed   By: Natasha Mead M.D.   On: 12/17/2012 16:06    EKG Interpretation   None       MDM   1. Contusion of forearm, right, initial encounter     Patients labs and/or radiological studies were viewed and considered during the medical decision making and disposition process. Pt was placed in velcro wrist splint which improved pain,  Encouraged RICE, pt deferred prescriptions.  F/u with his orthopedist [facility]) if not improving over the next week.    Discussed signs/sx of compartment syndrome.    Burgess Amor, PA-C 12/17/12 1649

## 2012-12-17 NOTE — ED Notes (Signed)
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

## 2012-12-17 NOTE — ED Provider Notes (Signed)
Medical screening examination/treatment/procedure(s) were performed by non-physician practitioner and as supervising physician I was immediately available for consultation/collaboration.  EKG Interpretation   None         Glynn Octave, MD 12/17/12 [phone]

## 2013-12-06 ENCOUNTER — Institutional Professional Consult (permissible substitution): Payer: Self-pay | Admitting: Neurology

## 2013-12-26 ENCOUNTER — Encounter: Payer: Self-pay | Admitting: Neurology

## 2013-12-26 ENCOUNTER — Ambulatory Visit (INDEPENDENT_AMBULATORY_CARE_PROVIDER_SITE_OTHER): Payer: Managed Care, Other (non HMO) | Admitting: Neurology

## 2013-12-26 VITALS — BP 123/77 | HR 56 | Temp 98.2°F | Wt 225.0 lb

## 2013-12-26 DIAGNOSIS — R7981 Abnormal blood-gas level: Secondary | ICD-10-CM

## 2013-12-26 DIAGNOSIS — G4733 Obstructive sleep apnea (adult) (pediatric): Secondary | ICD-10-CM

## 2013-12-26 DIAGNOSIS — E669 Obesity, unspecified: Secondary | ICD-10-CM

## 2013-12-26 NOTE — Progress Notes (Signed)
Subjective:    Patient ID: Justin [name] is a 63 y.o. male.  HPI     Star Age, MD, PhD [facility] [address] [place], [place] [address]  Dear Dr. Gerarda Fraction,  I saw your patient, Justin [name], upon your kind request in my neurologic clinic today for initial consultation of his sleep disorder, in particular, concern for underlying obstructive sleep apnea. The patient is unaccompanied today. As you know, Justin [name] is a 63 year old right-handed gentleman with an underlying medical history of reflux disease, osteoarthritis, obesity, kidney stones, IBS, who was in the process of being evaluated for surgery for L heel spur. He recently had an overnight pulse oximetry test on 11/11/2013 which I reviewed: Average oxygen saturation was 92.8%, nadir was 83%, average pulse was 57, lowest pulse was 46, heighest pulse was 82, total valid sampling time was 4 hours and 40 minutes. Total time below 89% saturation was 2 minutes and 24 seconds. He reports snoring and this can be loud. His wife has noted pauses in his breathing in his sleep. He is a stomach. He denies waking up with HAs. He has not fallen asleep driving. He drinks decaff coffee at home and at work 2 cups of coffee with caffeine, occasional sodas. He does not watch TV in bed. He has nocturia very occasionally. He drinks alcohol very rarely. He never smoked but quit chewing tobacco 2 years ago in October.  He had ex lap in 89 and was told he had partial SBO. He does have a history of SBO.   His typical bedtime is reported to be around 9:30 to 10 PM and usual wake time is around 4 AM. Sleep onset typically occurs within a few minutes. He reports feeling adequately to marginally rested upon awakening. He wakes up on an average 1 times in the middle of the night and has to go to the bathroom 0 to 1 time on a typical night. He denies morning headaches.  He denies excessive daytime somnolence (EDS) and  His Epworth Sleepiness Score (ESS) is 3/24 today. He has not fallen asleep while driving. The patient has not been taking a planned nap.  He has been known to snore for the past few years. Snoring is reportedly moderate, and associated with choking sounds and witnessed apneas. There is no report of nighttime reflux, as long as he takes his Nexium, with rare nighttime cough experienced. The patient has not noted any RLS symptoms and is not known to kick while asleep or before falling asleep. There is no family history of RLS or OSA.    He denies cataplexy, sleep paralysis, hypnagogic or hypnopompic hallucinations, or sleep attacks. He does not report any vivid dreams, nightmares, dream enactments, or parasomnias, such as sleep talking or sleep walking. The patient has not had a sleep study or a home sleep test.    His Past Medical History Is Significant For: Past Medical History  Diagnosis Date  . Small bowel obstruction   . Acid reflux   . Hypertension   . IBS (irritable bowel syndrome)   . Kidney stones     His Past Surgical History Is Significant For: Past Surgical History  Procedure Laterality Date  . Knee surgery  1980's.    bilateral  . Cholecystectomy  2003  . Colonoscopy  June 2004    Dr. [name]: normal  . Exploratory laparotomy      remote past  . Appendectomy    .  Colonoscopy N/A 08/31/2012    Procedure: COLONOSCOPY;  Surgeon: [name], MD;  Location: [facility];  Service: Endoscopy;  Laterality: N/A;  8:30    His Family History Is Significant For: Family History  Problem Relation Age of Onset  . Cancer Sister     Breast  . Colon cancer Neg Hx     His Social History Is Significant For: History   Social History  . Marital Status: Married    Spouse Name: Justin [name]    Number of Children: 3  . Years of Education: 12   Occupational History  . 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 Smoking status:  Never Smoker   . Smokeless tobacco: Former Systems developer    Types: Chew    Quit date: 12/01/2011  . Alcohol Use: No  . Drug Use: No  . Sexual Activity: None   Other Topics Concern  . None   Social History Narrative   Consumes caffeine occas, is left handed    His Allergies Are:  No Known Allergies:   His Current Medications Are:  Outpatient Encounter Prescriptions as of 12/26/2013  Medication Sig  . ALPRAZolam (XANAX) 1 MG tablet Take 1 mg by mouth at bedtime as needed for sleep.   Marland Kitchen esomeprazole (NEXIUM) 40 MG capsule Take 40 mg by mouth at bedtime.   Marland Kitchen losartan (COZAAR) 50 MG tablet 50 mg daily.  . meloxicam (MOBIC) 15 MG tablet 15 mg daily.  . nebivolol (BYSTOLIC) 10 MG tablet Take 10 mg by mouth daily.  Marland Kitchen nystatin-triamcinolone (MYCOLOG II) cream   :  Review of Systems:  Out of a complete 14 point review of systems, all are reviewed and negative with the exception of these symptoms as listed below:  Review of Systems  Neurological:       Snoring, not enough sleep    Objective:  Neurologic Exam  Physical Exam Physical Examination:   Filed Vitals:   12/26/13 0904  BP: 123/77  Pulse: 56  Temp: 98.2 F (36.8 C)    General Examination: The patient is a very pleasant 63 y.o. male in no acute distress. He appears well-developed and well-nourished and well groomed.   HEENT: Normocephalic, atraumatic, pupils are equal, round and reactive to light and accommodation. Funduscopic exam is normal with sharp disc margins noted. Extraocular tracking is good without limitation to gaze excursion or nystagmus noted. Normal smooth pursuit is noted. Hearing is grossly intact. Tympanic membranes are clear bilaterally. Face is symmetric with normal facial animation and normal facial sensation. Speech is clear with no dysarthria noted. There is no hypophonia. There is no lip, neck/head, jaw or voice tremor. Neck is supple with full range of passive and active motion. There are no carotid bruits  on auscultation. Oropharynx exam reveals: mild mouth dryness, adequate dental hygiene and moderate airway crowding, due to longer tongue, longer uvula, which appears swollen and tonsils of 2+. Mallampati is class II. Tongue protrudes centrally and palate elevates symmetrically. Neck size is 18 inches. He has a Absent overbite. Nasal inspection reveals no significant nasal mucosal bogginess or redness and no septal deviation.   Chest: Clear to auscultation without wheezing, rhonchi or crackles noted.  Heart: S1+S2+0, regular and normal without murmurs, rubs or gallops noted.   Abdomen: Soft, non-tender and non-distended with normal bowel sounds appreciated on auscultation.  Extremities: There is no pitting edema in the distal lower  extremities bilaterally. Pedal pulses are intact.  Skin: Warm and dry without trophic changes noted. There are no varicose veins.  Musculoskeletal: exam reveals no obvious joint deformities, tenderness or joint swelling or erythema, with the exception of bony prominence in the heel on the L with decrease in foot plantar flexion.   Neurologically:  Mental status: The patient is awake, alert and oriented in all 4 spheres. His immediate and remote memory, attention, language skills and fund of knowledge are appropriate. There is no evidence of aphasia, agnosia, apraxia or anomia. Speech is clear with normal prosody and enunciation. Thought process is linear. Mood is normal and affect is normal.  Cranial nerves II - XII are as described above under HEENT exam. In addition: shoulder shrug is normal with equal shoulder height noted. Motor exam: Normal bulk, strength and tone is noted. He has difficulty with foot plantarflexion on the L. There is no drift, tremor or rebound. Romberg is negative. Reflexes are 2+ throughout. Babinski: Toes are flexor bilaterally. Fine motor skills and coordination: intact with normal finger taps, normal hand movements, normal rapid alternating  patting, normal foot taps and normal foot agility.  Cerebellar testing: No dysmetria or intention tremor on finger to nose testing. Heel to shin is unremarkable bilaterally. There is no truncal or gait ataxia.  Sensory exam: intact to light touch, pinprick, vibration, temperature sense in the upper and lower extremities.  Gait, station and balance: He stands easily. No veering to one side is noted. No leaning to one side is noted. Posture is age-appropriate and stance is narrow based. Gait shows normal stride length and normal pace. No problems turning are noted. He turns en bloc. Tandem walk is unremarkable. Intact toe and heel stance is noted.               Assessment and Plan:   In summary, DEMAREA LOREY is a very pleasant 63 y.o.-year old male with an underlying medical history of reflux disease, osteoarthritis, obesity, irritable bowel syndrome, and left heel spur who is in the process of being scheduled for left heel surgery, who has a history and physical exam concerning for obstructive sleep apnea (OSA).  I had a long chat with the patient about my findings and the diagnosis of OSA, its prognosis and treatment options. We talked about medical treatments, surgical interventions and non-pharmacological approaches. I explained in particular the risks and ramifications of untreated moderate to severe OSA, especially with respect to developing cardiovascular disease down the Road, including congestive heart failure, difficult to treat hypertension, cardiac arrhythmias, or stroke. Even type 2 diabetes has, in part, been linked to untreated OSA. Symptoms of untreated OSA include daytime sleepiness, memory problems, mood irritability and mood disorder such as depression and anxiety, lack of energy, as well as recurrent headaches, especially morning headaches. We talked about trying to maintain a healthy lifestyle in general, as well as the importance of weight control. I encouraged the patient to eat  healthy, exercise daily and keep well hydrated, to keep a scheduled bedtime and wake time routine, to not skip any meals and eat healthy snacks in between meals. I advised the patient not to drive when feeling sleepy. I recommended the following at this time: sleep study with potential positive airway pressure titration.  I explained the sleep test procedure to the patient and also outlined possible surgical and non-surgical treatment options of OSA, including the use of a custom-made dental device (which would require a referral to a specialist dentist  or oral surgeon), upper airway surgical options, such as pillar implants, radiofrequency surgery, tongue base surgery, and UPPP (which would involve a referral to an ENT surgeon). Rarely, jaw surgery such as mandibular advancement may be considered.  I also explained the CPAP treatment option to the patient, who indicated that he would be willing to try CPAP if the need arises. I explained the importance of being compliant with PAP treatment, not only for insurance purposes but primarily to improve His symptoms, and for the patient's long term health benefit, including to reduce His cardiovascular risks. I answered all his questions today and the patient was in agreement. I would like to see him back after the sleep study is completed and encouraged him to call with any interim questions, concerns, problems or updates.   Thank you very much for allowing me to participate in the care of this nice patient. If I can be of any further assistance to you please do not hesitate to call me at [phone].  Sincerely,   Star Age, MD, PhD

## 2013-12-26 NOTE — Patient Instructions (Signed)

## 2013-12-28 ENCOUNTER — Ambulatory Visit (INDEPENDENT_AMBULATORY_CARE_PROVIDER_SITE_OTHER): Payer: Managed Care, Other (non HMO) | Admitting: Neurology

## 2013-12-28 DIAGNOSIS — G479 Sleep disorder, unspecified: Secondary | ICD-10-CM

## 2013-12-28 DIAGNOSIS — G4733 Obstructive sleep apnea (adult) (pediatric): Secondary | ICD-10-CM

## 2013-12-29 NOTE — Sleep Study (Signed)
Please see the scanned sleep study interpretation located in the Procedure tab within the Chart Review section. 

## 2014-01-05 ENCOUNTER — Telehealth: Payer: Self-pay | Admitting: Neurology

## 2014-01-05 DIAGNOSIS — G479 Sleep disorder, unspecified: Secondary | ICD-10-CM

## 2014-01-05 DIAGNOSIS — G4733 Obstructive sleep apnea (adult) (pediatric): Secondary | ICD-10-CM

## 2014-01-05 NOTE — Telephone Encounter (Signed)
Please call and notify patient that the recent sleep study confirmed the diagnosis of OSA. He did well with CPAP during the study with significant improvement of the respiratory events. Therefore, I would like start the patient on CPAP at home. I placed the order in the chart.   Arrange for CPAP set up at home through a DME company of patient's choice and fax/route report to PCP and referring MD (if other than PCP).   The patient will also need a follow up appointment with me in 6-8 weeks post set up that has to be scheduled; help the patient schedule this (in a follow-up slot).   Please re-enforce the importance of compliance with treatment and the need for us to monitor compliance data.   Once you have spoken to the patient and scheduled the return appointment, you may close this encounter, thanks,   Evyn Kooyman, MD, PhD [facility] ([facility]    

## 2014-01-08 ENCOUNTER — Encounter: Payer: Self-pay | Admitting: Neurology

## 2014-01-08 ENCOUNTER — Encounter: Payer: Self-pay | Admitting: *Deleted

## 2014-01-08 NOTE — Telephone Encounter (Signed)
Patient was contacted and provided the results of his sleep study that revealed sleep apnea.  Patient was informed that the CPAP therapy was recommended by Dr. Rexene Alberts for home use and he was referred to [facility] for CPAP set up.  The patient was mailed a copy of the sleep results and Dr. Redmond School was faxed a copy of the results.

## 2014-01-09 ENCOUNTER — Telehealth: Payer: Self-pay | Admitting: *Deleted

## 2014-01-09 NOTE — Telephone Encounter (Signed)
Patient had been referred to [facility] for DME set up.  DME contacted me and stated that they were not in network with his insurance.  Patient to be referred to [place].

## 2014-04-25 NOTE — Progress Notes (Signed)
Quick Note:  I reviewed the patient's compliance data with CPAP therapy at a pressure of 7 cm with EPR of 3. This is from 02/28/2014 through 03/29/2014 which is a total of 30 days during which time the patient used his CPAP machine only 23 days with percent used days greater than 4 hours of only 50%, indicating suboptimal compliance with an average usage of 3 hours and 36 minutes only. Residual AHI was acceptable at 3.1 per hour and leak from the mask is acceptable at 11 L/m at the 95th percentile. Please get in touch with patient, he needs to use his machine every night and not skip any nights. Please discuss any obstacles he may have to using his CPAP machine every night and he may have to get in touch with his DME company as well for further advice. I do not see a pending appointment for follow-up. Please make sure he has an appointment in sleep clinic with me. Star Age, MD, PhD [facility] ([facility]  ______

## 2014-05-26 ENCOUNTER — Emergency Department (HOSPITAL_COMMUNITY)
Admission: EM | Admit: 2014-05-26 | Discharge: 2014-05-26 | Disposition: A | Discharge status: Home / Self Care | Payer: Managed Care, Other (non HMO) | Attending: Emergency Medicine | Admitting: Emergency Medicine

## 2014-05-26 ENCOUNTER — Encounter (HOSPITAL_COMMUNITY): Payer: Self-pay

## 2014-05-26 DIAGNOSIS — M25522 Pain in left elbow: Secondary | ICD-10-CM | POA: Diagnosis not present

## 2014-05-26 DIAGNOSIS — H538 Other visual disturbances: Secondary | ICD-10-CM

## 2014-05-26 DIAGNOSIS — I10 Essential (primary) hypertension: Secondary | ICD-10-CM | POA: Insufficient documentation

## 2014-05-26 DIAGNOSIS — Z7952 Long term (current) use of systemic steroids: Secondary | ICD-10-CM | POA: Insufficient documentation

## 2014-05-26 DIAGNOSIS — Z791 Long term (current) use of non-steroidal anti-inflammatories (NSAID): Secondary | ICD-10-CM | POA: Insufficient documentation

## 2014-05-26 DIAGNOSIS — K219 Gastro-esophageal reflux disease without esophagitis: Secondary | ICD-10-CM | POA: Insufficient documentation

## 2014-05-26 DIAGNOSIS — Z79899 Other long term (current) drug therapy: Secondary | ICD-10-CM | POA: Diagnosis not present

## 2014-05-26 DIAGNOSIS — R197 Diarrhea, unspecified: Secondary | ICD-10-CM | POA: Insufficient documentation

## 2014-05-26 DIAGNOSIS — Z87442 Personal history of urinary calculi: Secondary | ICD-10-CM | POA: Diagnosis not present

## 2014-05-26 DIAGNOSIS — M2550 Pain in unspecified joint: Secondary | ICD-10-CM

## 2014-05-26 DIAGNOSIS — M25562 Pain in left knee: Secondary | ICD-10-CM | POA: Diagnosis not present

## 2014-05-26 MED ORDER — PREDNISONE 20 MG PO TABS: 20.0000 mg | ORAL_TABLET | Freq: Two times a day (BID) | ORAL | Status: DC

## 2014-05-26 MED ORDER — PREDNISONE 50 MG PO TABS
60.0000 mg | ORAL_TABLET | Freq: Once | ORAL | Status: AC
  Administered 2014-05-26: 60 mg via ORAL
  Filled 2014-05-26 (×2): qty 1

## 2014-05-26 NOTE — ED Notes (Signed)
Patient has multiple complaints which include vomiting Sunday, diarrhea for the last 6 days. Pt states he's "so dehydrated my vision is blurry and joints are painful". Hx of GI blockages

## 2014-05-26 NOTE — Discharge Instructions (Signed)

## 2014-05-26 NOTE — ED Notes (Signed)
Pt states that he has had previous bowel obstructions. Sunday pt had x1 episode of vomiting, pt has had diarrhea since Monday. Pain is no present in his abdomen at this time. Pt states that he usually has joint pain after GI upset "flares" and the same has occurred this time.   Pt states he is having blurred vision that he noticed started yesterday morning. NIH 0. Pt alert and oriented and denies any weakness at the time of vision loss.

## 2014-05-26 NOTE — ED Provider Notes (Signed)
CSN: [id number]     Arrival date & time 05/26/14  1406 History   First MD Initiated Contact with Patient 05/26/14 1503     Chief Complaint  Patient presents with  . Diarrhea     (Consider location/radiation/quality/duration/timing/severity/associated sxs/prior Treatment) Patient is a 64 y.o. male presenting with diarrhea. The history is provided by the patient.  Diarrhea  Justin [name] is a 64 y.o. male who is here for evaluation of diarrhea, achiness, left elbow and knee, and right eye blurriness. Joint pain is present for several days and is typical for similar joint pain in the past that required prednisone. He does not have a diagnosed arthralgia. He has had some diarrhea preceded by vomiting about a week ago. Diarrhea is improving. He denies fever, chills, cough, shortness of breath or chest pain. Blurriness in his right eye started yesterday and this was after wearing some new eyeglasses over the last couple weeks. Additional double vision. He denies localized weakness, paresthesias, or headache. There are no other known modifying factors.   Past Medical History  Diagnosis Date  . Small bowel obstruction   . Acid reflux   . Hypertension   . IBS (irritable bowel syndrome)   . Kidney stones    Past Surgical History  Procedure Laterality Date  . Knee surgery  1980's.    bilateral  . Cholecystectomy  2003  . Colonoscopy  June 2004    Dr. [name]: normal  . Exploratory laparotomy      remote past  . Appendectomy    . Colonoscopy N/A 08/31/2012    Procedure: COLONOSCOPY;  Surgeon: [name], MD;  Location: [facility];  Service: Endoscopy;  Laterality: N/A;  8:30   Family History  Problem Relation Age of Onset  . Cancer Sister     Breast  . Colon cancer Neg Hx    History  Substance Use Topics  . Smoking status: Never Smoker   . Smokeless tobacco: Former Systems developer    Types: Chew    Quit date: 12/01/2011  . Alcohol Use: No    Review of Systems  Gastrointestinal:  Positive for diarrhea.  All other systems reviewed and are negative.     Allergies  Review of patient's allergies indicates no known allergies.  Home Medications   Prior to Admission medications   Medication Sig Start Date End Date Taking? Authorizing Provider  ALPRAZolam Duanne Moron) 1 MG tablet Take 1 mg by mouth at bedtime as needed for sleep.  06/06/12   Historical Provider, MD  esomeprazole (NEXIUM) 40 MG capsule Take 40 mg by mouth at bedtime.     Historical Provider, MD  losartan (COZAAR) 50 MG tablet 50 mg daily. 11/30/13   Historical Provider, MD  meloxicam (MOBIC) 15 MG tablet 15 mg daily. 11/19/13   Historical Provider, MD  nebivolol (BYSTOLIC) 10 MG tablet Take 10 mg by mouth daily.    Historical Provider, MD  nystatin-triamcinolone (MYCOLOG II) cream  10/06/13   Historical Provider, MD  predniSONE (DELTASONE) 20 MG tablet Take 1 tablet (20 mg total) by mouth 2 (two) times daily. 05/26/14   Daleen Bo, MD   BP 131/81 mmHg  Pulse 68  Temp(Src) 98.2 F (36.8 C) (Oral)  Resp 16  Ht 6' (1.829 m)  Wt 220 lb (99.791 kg)  BMI 29.83 kg/m2  SpO2 100% Physical Exam  Constitutional: He is oriented to person, place, and time. He appears well-developed and well-nourished. No distress.  HENT:  Head: Normocephalic and atraumatic.  Right Ear: External ear normal.  Left Ear: External ear normal.  Eyes: Conjunctivae and EOM are normal. Pupils are equal, round, and reactive to light.  Neck: Normal range of motion and phonation normal. Neck supple.  Cardiovascular: Normal rate, regular rhythm and normal heart sounds.   Pulmonary/Chest: Effort normal and breath sounds normal. He exhibits no bony tenderness.  Abdominal: Soft. There is no tenderness.  Musculoskeletal: Normal range of motion.  Tender left elbow, and left knee, both without deformity or swelling. Somewhat diminished range of motion left elbow secondary to pain. Normal range of motion left knee.  Neurological: He is alert and  oriented to person, place, and time. No cranial nerve deficit or sensory deficit. He exhibits normal muscle tone. Coordination normal.  No dysarthria and aphasia or nystagmus. No ataxia.  Skin: Skin is warm, dry and intact.  Psychiatric: He has a normal mood and affect. His behavior is normal. Judgment and thought content normal.  Nursing note and vitals reviewed.   ED Course  Procedures (including critical care time)  Medications  predniSONE (DELTASONE) tablet 60 mg (60 mg Oral Given 05/26/14 1542)    Patient Vitals for the past 24 hrs:  BP Temp Temp src Pulse Resp SpO2 Height Weight  05/26/14 1544 131/81 mmHg 98.2 F (36.8 C) Oral 68 16 100 % - -  05/26/14 1511 123/75 mmHg - - 70 14 100 % - -  05/26/14 1416 148/90 mmHg 98.2 F (36.8 C) Oral (!) 53 16 100 % 6' (1.829 m) 220 lb (99.791 kg)      Findings discussed with patient, and daughter, all questions answered.  Labs Review Labs Reviewed - No data to display  Imaging Review No results found.   EKG Interpretation None      MDM   Final diagnoses:  Arthralgia  Blurred vision    Nonspecific arthralgia and blurred vision. Doubt occult fracture, septic arthritis, metabolic instability or CVA.  Nursing Notes Reviewed/ Care Coordinated Applicable Imaging Reviewed Interpretation of Laboratory Data incorporated into ED treatment  The patient appears reasonably screened and/or stabilized for discharge and I doubt any other medical condition or other [facility] requiring further screening, evaluation, or treatment in the ED at this time prior to discharge.  Plan: Home Medications- prednisone; Home Treatments- rest; return here if the recommended treatment, does not improve the symptoms; Recommended follow up- PCP, when necessary     Daleen Bo, MD 05/27/14 [phone]

## 2014-06-28 ENCOUNTER — Telehealth: Payer: Self-pay

## 2014-06-28 NOTE — Telephone Encounter (Signed)
I spoke to the patient and he is aware of the results below. He reports at the beginning of April, he got the flu which developed into pneumonia. He has not been able to wear his CPAP due to constant coughing. He states that last night is the first time he has been able to wear it since then. And was only able to wear it for 2.5hrs. He reports understanding the importance of using CPAP and enjoys using it.

## 2014-06-28 NOTE — Telephone Encounter (Signed)
-----   Message from Jonathon Jordan sent at 06/26/2014  2:45 PM EDT -----   ----- Message -----    From: Star Age, MD    Sent: 04/25/2014   1:26 PM      To: Virl Diamond Plemmons  I reviewed the patient's compliance data with CPAP therapy at a pressure of 7 cm with EPR of 3. This is from 02/28/2014 through 03/29/2014 which is a total of 30 days during which time the patient used his CPAP machine only 23 days with percent used days greater than 4 hours of only 50%, indicating suboptimal compliance with an average usage of 3 hours and 36 minutes only. Residual AHI was acceptable at 3.1 per hour and leak from the mask is acceptable at 11 L/m at the 95th percentile. Please get in touch with patient, he needs to use his machine every night and not skip any nights. Please discuss any obstacles he may have to using his CPAP machine every night and he may have to get in touch with his DME company as well for further advice. I do not see a pending appointment for follow-up. Please make sure he has an appointment in sleep clinic with me. Star Age, MD, PhD [facility])

## 2014-07-07 IMAGING — CT CT ENTEROGRAPHY (ABD-PELV W/ CM)
2 of 6 series · 17 of 46 positions shown, 19 images · IV contrast (CONTRAST)
Comparison: 02/19/2012

CLINICAL DATA: Recurrent small bowel obstructions.  Previous
cholecystectomy and exploratory laparotomy.

CT ABDOMEN AND PELVIS WITH CONTRAST (CT ENTEROGRAPHY)
TECHNIQUE: Multidetector CT of the abdomen and pelvis during bolus
administration of intravenous contrast. Negative oral contrast
VoLumen was given.
Contrast: 100mL OMNIPAQUE IOHEXOL 300 MG/ML  SOLN

[Series 4: entero thins · axial · 0.80mm/px · z∈[-78,+388]mm · 14 of 263 slices shown, 16 images]
[im 15/263  soft-tissue]
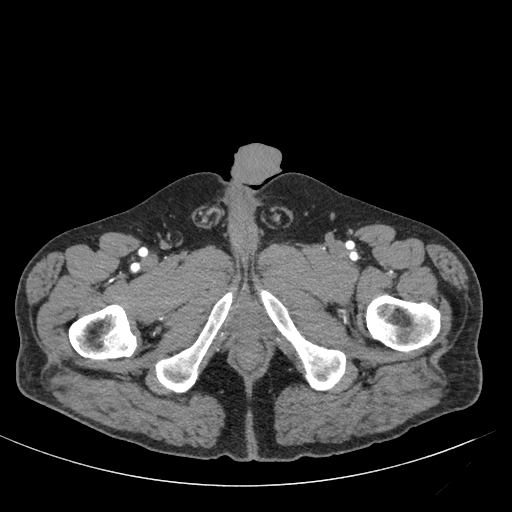
[im 15/263  bone]
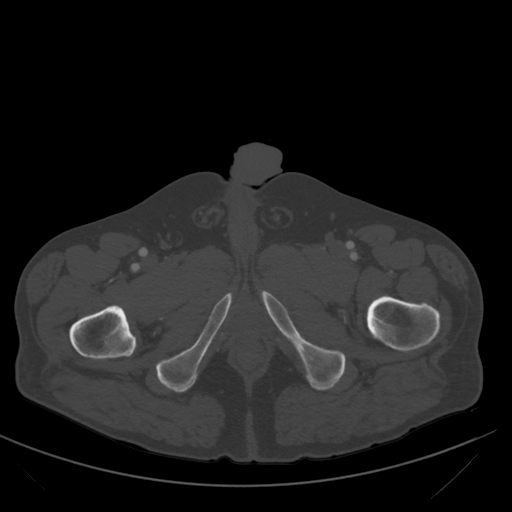
[im 30/263  soft-tissue]
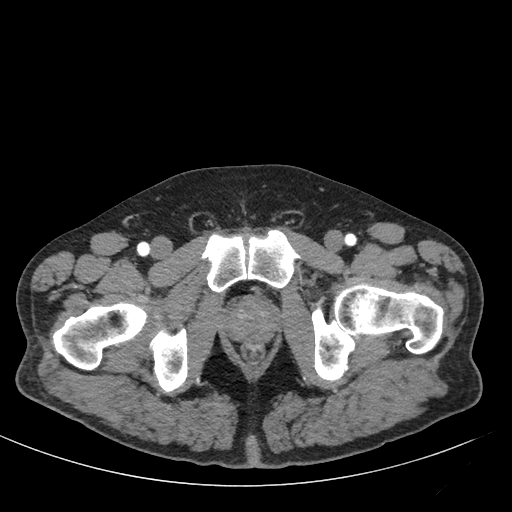
[im 59/263  soft-tissue]
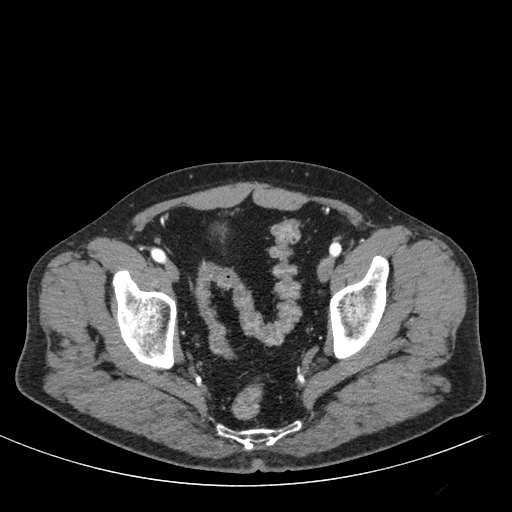
[im 73/263  soft-tissue]
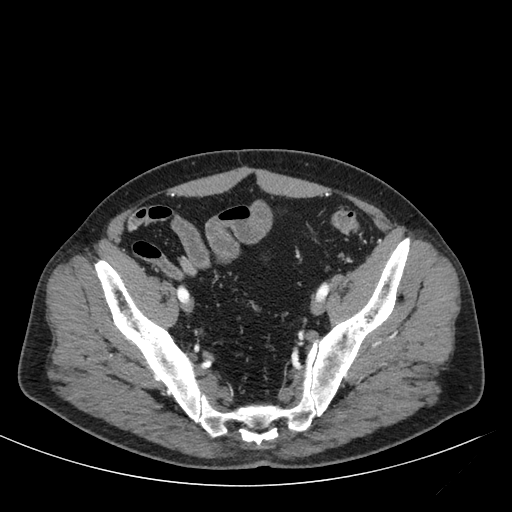
[im 88/263  soft-tissue]
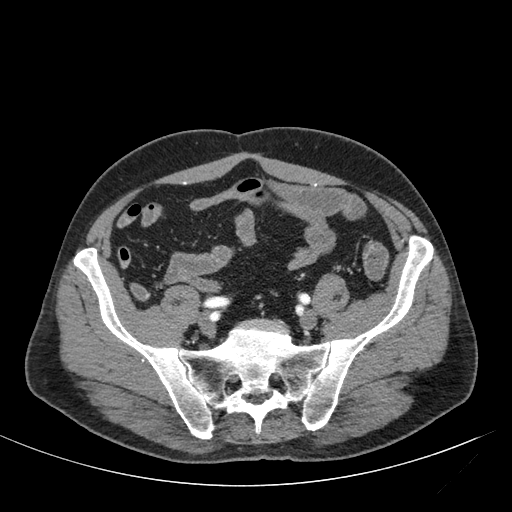
[im 102/263  soft-tissue]
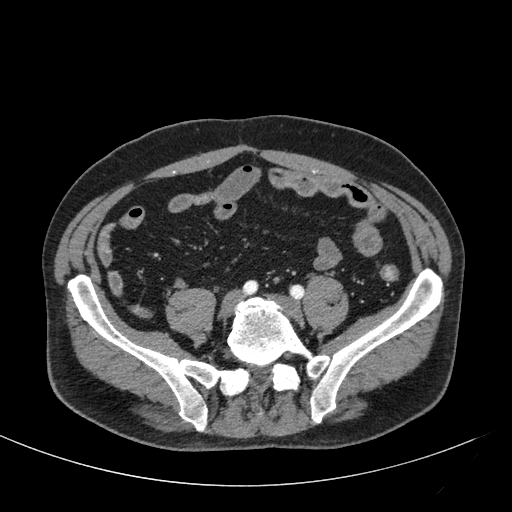
[im 117/263  soft-tissue]
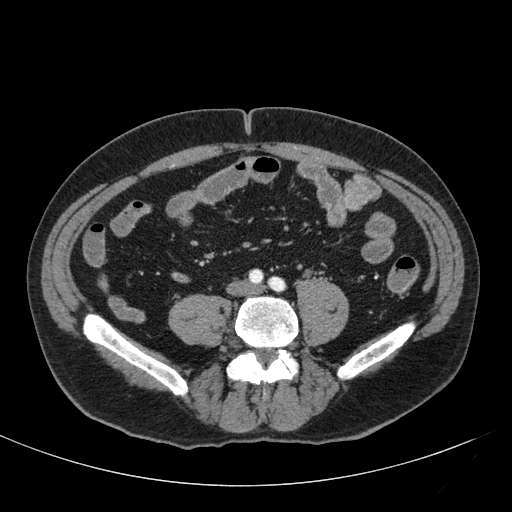
[im 146/263  soft-tissue]
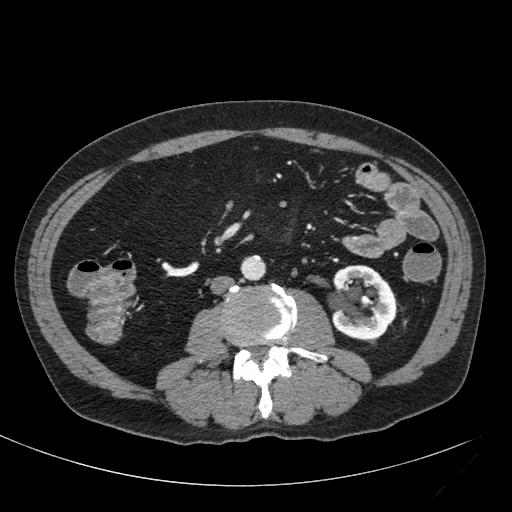
[im 161/263  soft-tissue]
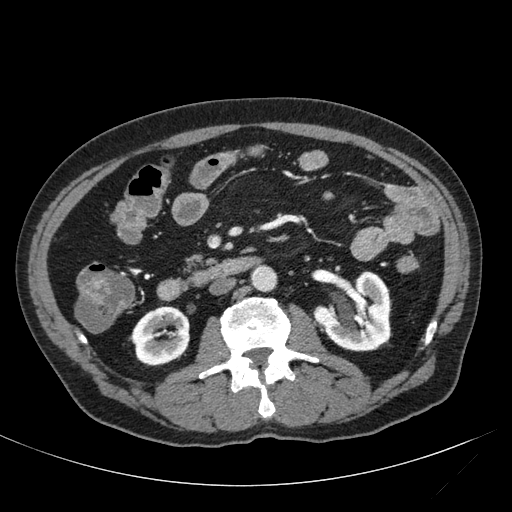
[im 161/263  bone]
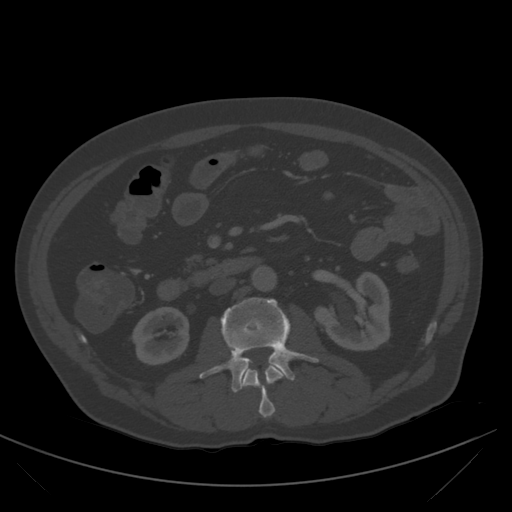
[im 175/263  soft-tissue]
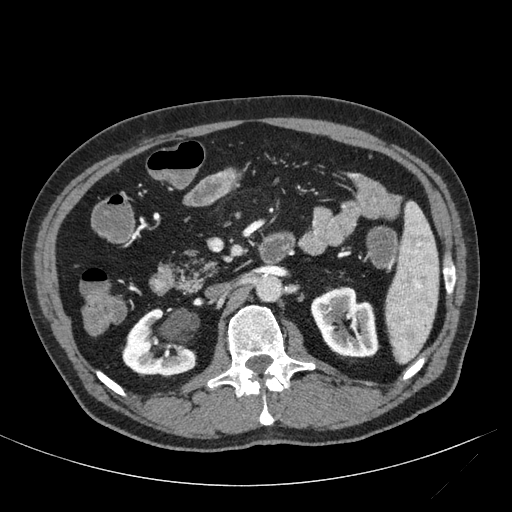
[im 190/263  soft-tissue]
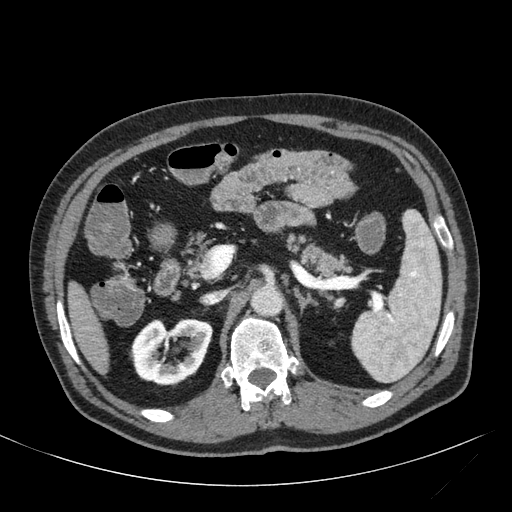
[im 204/263  soft-tissue]
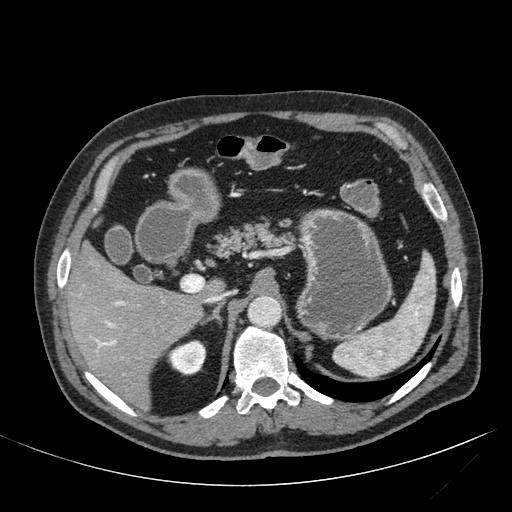
[im 233/263  soft-tissue]
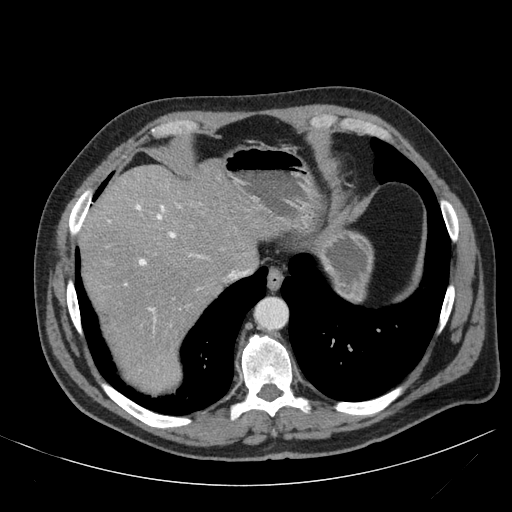
[im 248/263  soft-tissue]
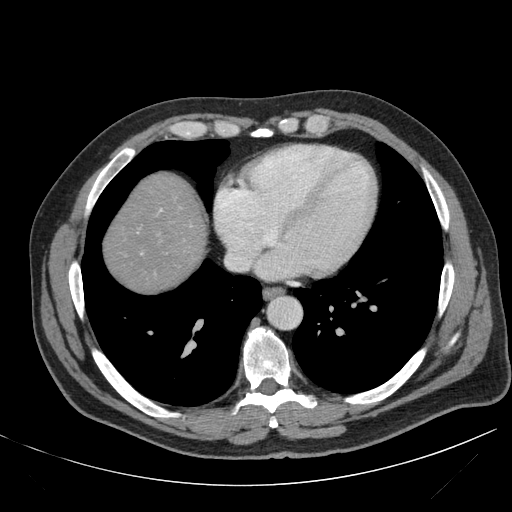

[coronals · coronal · 1.02mm/px · 3 of 112 slices shown]
[im 38/112  soft-tissue]
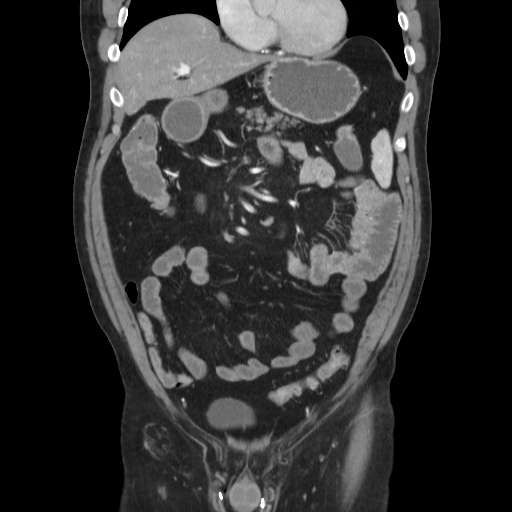
[im 50/112  soft-tissue]
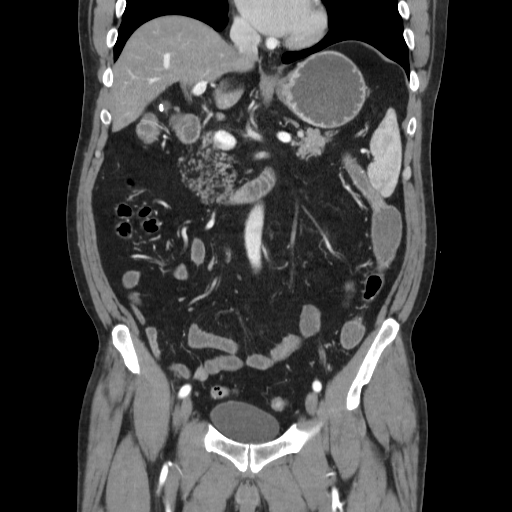
[im 62/112  soft-tissue]
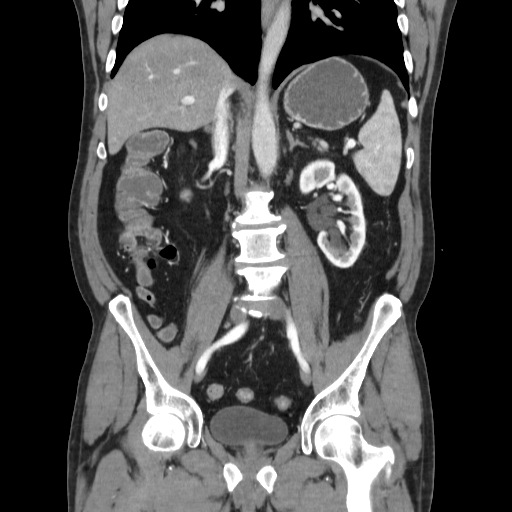

[17 of 46 positions shown; findings below may reference images not displayed]

FINDINGS: No evidence of small bowel dilatation wall thickening.
No evidence of abnormal mucosal enhancement or mesenteric
inflammatory changes.  The terminal ileum is normal appearance.  No
evidence of fistulization or extraluminal gas or fluid collections.
No other areas of bowel wall thickening identified.

No other inflammatory process or abnormal fluid collections
identified within the abdomen or pelvis.  Mild hepatic steatosis is
again demonstrated.  Prior cholecystectomy again noted.  No liver
masses are identified.  The pancreas, spleen, adrenal glands, and
kidneys are normal appearance.  No evidence of hydronephrosis.
IMPRESSION: 1.  No radiographic evidence of inflammatory bowel disease or bowel
obstruction.
2.  No other acute findings.
3.  Mild hepatic steatosis.

## 2015-05-27 ENCOUNTER — Emergency Department (HOSPITAL_COMMUNITY): Payer: Managed Care, Other (non HMO)

## 2015-05-27 ENCOUNTER — Inpatient Hospital Stay (HOSPITAL_COMMUNITY)
Admission: EM | Admit: 2015-05-27 | Discharge: 2015-05-28 | DRG: 389 | Disposition: A | Discharge status: Home / Self Care | Payer: Managed Care, Other (non HMO) | Attending: Internal Medicine | Admitting: Internal Medicine

## 2015-05-27 ENCOUNTER — Encounter (HOSPITAL_COMMUNITY): Payer: Self-pay | Admitting: Emergency Medicine

## 2015-05-27 DIAGNOSIS — Z6829 Body mass index (BMI) 29.0-29.9, adult: Secondary | ICD-10-CM

## 2015-05-27 DIAGNOSIS — Z87442 Personal history of urinary calculi: Secondary | ICD-10-CM

## 2015-05-27 DIAGNOSIS — I10 Essential (primary) hypertension: Secondary | ICD-10-CM | POA: Diagnosis present

## 2015-05-27 DIAGNOSIS — K5660 Unspecified intestinal obstruction: Principal | ICD-10-CM | POA: Diagnosis present

## 2015-05-27 DIAGNOSIS — Z809 Family history of malignant neoplasm, unspecified: Secondary | ICD-10-CM

## 2015-05-27 DIAGNOSIS — E860 Dehydration: Secondary | ICD-10-CM | POA: Diagnosis present

## 2015-05-27 DIAGNOSIS — K5669 Other intestinal obstruction: Secondary | ICD-10-CM | POA: Diagnosis not present

## 2015-05-27 DIAGNOSIS — R112 Nausea with vomiting, unspecified: Secondary | ICD-10-CM | POA: Diagnosis present

## 2015-05-27 DIAGNOSIS — Z9049 Acquired absence of other specified parts of digestive tract: Secondary | ICD-10-CM | POA: Diagnosis not present

## 2015-05-27 DIAGNOSIS — N179 Acute kidney failure, unspecified: Secondary | ICD-10-CM | POA: Diagnosis present

## 2015-05-27 DIAGNOSIS — D6959 Other secondary thrombocytopenia: Secondary | ICD-10-CM | POA: Diagnosis present

## 2015-05-27 DIAGNOSIS — Z87891 Personal history of nicotine dependence: Secondary | ICD-10-CM | POA: Diagnosis not present

## 2015-05-27 DIAGNOSIS — E669 Obesity, unspecified: Secondary | ICD-10-CM | POA: Diagnosis present

## 2015-05-27 DIAGNOSIS — R1013 Epigastric pain: Secondary | ICD-10-CM | POA: Diagnosis present

## 2015-05-27 DIAGNOSIS — D649 Anemia, unspecified: Secondary | ICD-10-CM | POA: Diagnosis present

## 2015-05-27 DIAGNOSIS — K56609 Unspecified intestinal obstruction, unspecified as to partial versus complete obstruction: Secondary | ICD-10-CM | POA: Diagnosis present

## 2015-05-27 DIAGNOSIS — D696 Thrombocytopenia, unspecified: Secondary | ICD-10-CM | POA: Diagnosis not present

## 2015-05-27 DIAGNOSIS — K219 Gastro-esophageal reflux disease without esophagitis: Secondary | ICD-10-CM | POA: Diagnosis present

## 2015-05-27 DIAGNOSIS — R1110 Vomiting, unspecified: Secondary | ICD-10-CM | POA: Diagnosis not present

## 2015-05-27 DIAGNOSIS — R197 Diarrhea, unspecified: Secondary | ICD-10-CM | POA: Diagnosis not present

## 2015-05-27 LAB — CBC WITH DIFFERENTIAL/PLATELET
Basophils Absolute: 0 10*3/uL (ref 0.0–0.1)
Basophils Relative: 0 %
Eosinophils Absolute: 0.1 10*3/uL (ref 0.0–0.7)
Eosinophils Relative: 2 %
HCT: 43.2 % (ref 39.0–52.0)
Hemoglobin: 15.2 g/dL (ref 13.0–17.0)
Lymphocytes Relative: 25 %
Lymphs Abs: 1.4 10*3/uL (ref 0.7–4.0)
MCH: 30.7 pg (ref 26.0–34.0)
MCHC: 35.2 g/dL (ref 30.0–36.0)
MCV: 87.3 fL (ref 78.0–100.0)
Monocytes Absolute: 0.7 10*3/uL (ref 0.1–1.0)
Monocytes Relative: 13 %
Neutro Abs: 3.4 10*3/uL (ref 1.7–7.7)
Neutrophils Relative %: 60 %
Platelets: 173 10*3/uL (ref 150–400)
RBC: 4.95 MIL/uL (ref 4.22–5.81)
RDW: 12.8 % (ref 11.5–15.5)
WBC: 5.7 10*3/uL (ref 4.0–10.5)

## 2015-05-27 LAB — URINALYSIS, ROUTINE W REFLEX MICROSCOPIC
Bilirubin Urine: NEGATIVE
Glucose, UA: NEGATIVE mg/dL
Hgb urine dipstick: NEGATIVE
Ketones, ur: NEGATIVE mg/dL
Leukocytes, UA: NEGATIVE
Nitrite: NEGATIVE
Protein, ur: NEGATIVE mg/dL
Specific Gravity, Urine: 1.01 (ref 1.005–1.030)
pH: 6.5 (ref 5.0–8.0)

## 2015-05-27 LAB — BASIC METABOLIC PANEL
ANION GAP: 9 (ref 5–15)
BUN: 36 mg/dL — ABNORMAL HIGH (ref 6–20)
CALCIUM: 8.6 mg/dL — AB (ref 8.9–10.3)
CO2: 26 mmol/L (ref 22–32)
Chloride: 105 mmol/L (ref 101–111)
Creatinine, Ser: 1.47 mg/dL — ABNORMAL HIGH (ref 0.61–1.24)
GFR, EST AFRICAN AMERICAN: 56 mL/min — AB (ref >60)
GFR, EST NON AFRICAN AMERICAN: 49 mL/min — AB (ref >60)
GLUCOSE: 109 mg/dL — AB (ref 65–99)
POTASSIUM: 3.5 mmol/L (ref 3.5–5.1)
SODIUM: 140 mmol/L (ref 135–145)

## 2015-05-27 LAB — LIPASE, BLOOD: Lipase: 45 U/L (ref 11–51)

## 2015-05-27 MED ORDER — ONDANSETRON HCL 4 MG/2ML IJ SOLN
4.0000 mg | Freq: Four times a day (QID) | INTRAMUSCULAR | Status: DC
  Administered 2015-05-27 – 2015-05-28 (×3): 4 mg via INTRAVENOUS
  Filled 2015-05-27 (×3): qty 2

## 2015-05-27 MED ORDER — ACETAMINOPHEN 325 MG PO TABS: 650.0000 mg | ORAL_TABLET | Freq: Four times a day (QID) | ORAL | Status: DC | PRN

## 2015-05-27 MED ORDER — HYDROMORPHONE HCL 1 MG/ML IJ SOLN: 1.0000 mg | INTRAMUSCULAR | Status: DC | PRN

## 2015-05-27 MED ORDER — SODIUM CHLORIDE 0.9 % IV BOLUS (SEPSIS)
1000.0000 mL | Freq: Once | INTRAVENOUS | Status: AC
  Administered 2015-05-27: 1000 mL via INTRAVENOUS

## 2015-05-27 MED ORDER — ACETAMINOPHEN 650 MG RE SUPP: 650.0000 mg | Freq: Four times a day (QID) | RECTAL | Status: DC | PRN

## 2015-05-27 MED ORDER — IOHEXOL 300 MG/ML  SOLN
100.0000 mL | Freq: Once | INTRAMUSCULAR | Status: AC | PRN
  Administered 2015-05-27: 100 mL via INTRAVENOUS

## 2015-05-27 MED ORDER — SODIUM CHLORIDE 0.9 % IV SOLN: INTRAVENOUS | Status: DC

## 2015-05-27 MED ORDER — ENOXAPARIN SODIUM 40 MG/0.4ML ~~LOC~~ SOLN
40.0000 mg | SUBCUTANEOUS | Status: DC
  Administered 2015-05-27: 40 mg via SUBCUTANEOUS
  Filled 2015-05-27: qty 0.4

## 2015-05-27 MED ORDER — PROMETHAZINE HCL 12.5 MG PO TABS: 12.5000 mg | ORAL_TABLET | Freq: Four times a day (QID) | ORAL | Status: DC | PRN

## 2015-05-27 MED ORDER — PANTOPRAZOLE SODIUM 40 MG IV SOLR
40.0000 mg | INTRAVENOUS | Status: DC
  Administered 2015-05-27: 40 mg via INTRAVENOUS
  Filled 2015-05-27: qty 40

## 2015-05-27 MED ORDER — ALPRAZOLAM 1 MG PO TABS: 1.0000 mg | ORAL_TABLET | Freq: Every evening | ORAL | Status: DC | PRN

## 2015-05-27 MED ORDER — ONDANSETRON HCL 4 MG/2ML IJ SOLN
4.0000 mg | Freq: Once | INTRAMUSCULAR | Status: AC
  Administered 2015-05-27: 4 mg via INTRAVENOUS
  Filled 2015-05-27: qty 2

## 2015-05-27 MED ORDER — ALBUTEROL SULFATE (2.5 MG/3ML) 0.083% IN NEBU: 2.5000 mg | INHALATION_SOLUTION | RESPIRATORY_TRACT | Status: DC | PRN

## 2015-05-27 MED ORDER — KCL IN DEXTROSE-NACL 20-5-0.9 MEQ/L-%-% IV SOLN
INTRAVENOUS | Status: DC
  Administered 2015-05-27 – 2015-05-28 (×2): via INTRAVENOUS

## 2015-05-27 MED ORDER — SODIUM CHLORIDE 0.9 % IV SOLN
Freq: Once | INTRAVENOUS | Status: AC
Start: 2015-05-27 — End: 2015-05-27
  Administered 2015-05-27: 14:00:00 via INTRAVENOUS

## 2015-05-27 MED ORDER — METOPROLOL TARTRATE 1 MG/ML IV SOLN: 5.0000 mg | INTRAVENOUS | Status: DC | PRN

## 2015-05-27 NOTE — ED Provider Notes (Signed)
CSN: [id number]     Arrival date & time 05/27/15  1120 History  By signing my name below, I, Justin [name], attest that this documentation has been prepared under the direction and in the presence of Justin Alberts, MD.   Electronically Signed: Nicole [name], ED Scribe. 05/27/2015. 5:55 PM  Chief Complaint  Patient presents with  . Emesis   The history is provided by the patient. No language interpreter was used.   HPI Comments: ODAI HUTZELL is a 66 y.o. male with PMHx of small bowel obstruction and acid reflux, and PSHx of cholecystectomy and appendectomy presents to the Emergency Department complaining of gradual onset diarrhea, ongoing for four days. Pt reports associated nausea, vomiting, flatulence, and abdominal pain. His symptoms began after eating dinner on 05/23/2015. He was unable to have a bowel movement this weekend. Currently, he states that his abdominal pain and vomiting have alleviated. He has had several episodes of green diarrhea today. No other associated symptoms noted. He believes that his bowel movements may have helped to alleviate his abdominal pain. No other worsening or alleviating factors noted. Pt denies fever, hematochezia, chest pain, shortness of breath, loss of appetite, dysuria, or any other pertinent symptoms.   Past Medical History  Diagnosis Date  . Small bowel obstruction ([place])   . Acid reflux   . Hypertension   . IBS (irritable bowel syndrome)   . Kidney stones    Past Surgical History  Procedure Laterality Date  . Knee surgery  1980's.    bilateral  . Cholecystectomy  2003  . Colonoscopy  June 2004    Dr. [name]: normal  . Exploratory laparotomy      remote past  . Appendectomy    . Colonoscopy N/A 08/31/2012    Procedure: COLONOSCOPY;  Surgeon: [name], MD;  Location: [facility];  Service: Endoscopy;  Laterality: N/A;  8:30   Family History  Problem Relation Age of Onset  . Cancer Sister     Breast  . Colon cancer Neg Hx     Social History  Substance Use Topics  . Smoking status: Never Smoker   . Smokeless tobacco: Former Systems developer    Types: Chew    Quit date: 12/01/2011  . Alcohol Use: No    Review of Systems  10 Systems reviewed and all are negative for acute change except as noted in the HPI.  Allergies  Review of patient's allergies indicates no known allergies.  Home Medications   Prior to Admission medications   Medication Sig Start Date End Date Taking? Authorizing Provider  albuterol (PROVENTIL HFA;VENTOLIN HFA) 108 (90 Base) MCG/ACT inhaler Inhale 2 puffs into the lungs every 6 (six) hours as needed for wheezing or shortness of breath.   Yes Historical Provider, MD  ALPRAZolam Duanne Moron) 1 MG tablet Take 1 mg by mouth at bedtime as needed for sleep.  06/06/12  Yes Historical Provider, MD  colchicine 0.6 MG tablet Take 1 tablet by mouth daily as needed (gout flare).  01/30/15  Yes Historical Provider, MD  esomeprazole (NEXIUM) 40 MG capsule Take 40 mg by mouth at bedtime.    Yes Historical Provider, MD  losartan (COZAAR) 100 MG tablet Take 100 mg by mouth daily.   Yes Historical Provider, MD  meloxicam (MOBIC) 15 MG tablet Take 15 mg by mouth daily as needed for pain.  11/19/13  Yes Historical Provider, MD  nebivolol (BYSTOLIC) 10 MG tablet Take 10 mg by mouth daily.   Yes Historical  Provider, MD   BP 124/81 mmHg  Pulse 72  Temp(Src) 98 F (36.7 C) (Oral)  Resp 16  Ht 6' (1.829 m)  Wt 218 lb (98.884 kg)  BMI 29.56 kg/m2  SpO2 100% Physical Exam  Constitutional: He is oriented to person, place, and time. He appears well-developed and well-nourished. No distress.  HENT:  Head: Normocephalic and atraumatic.  Mouth/Throat: No oropharyngeal exudate.  Dry mucous membranes.   Eyes: Conjunctivae and EOM are normal. Pupils are equal, round, and reactive to light.  Neck: Normal range of motion. Neck supple.  No meningismus.  Cardiovascular: Normal rate, regular rhythm, normal heart sounds and intact  distal pulses.   No murmur heard. Pulmonary/Chest: Effort normal and breath sounds normal. No respiratory distress.  Abdominal: Soft. There is no tenderness. There is no rebound and no guarding.  Well healed midline surgical incision.  No CVA TTP.   Musculoskeletal: Normal range of motion. He exhibits no edema or tenderness.  Neurological: He is alert and oriented to person, place, and time. No cranial nerve deficit. He exhibits normal muscle tone. Coordination normal.  No ataxia on finger to nose bilaterally. No pronator drift. 5/5 strength throughout. CN 2-12 intact.Equal grip strength. Sensation intact.   Skin: Skin is warm.  Psychiatric: He has a normal mood and affect. His behavior is normal.  Nursing note and vitals reviewed.   ED Course  Procedures (including critical care time) DIAGNOSTIC STUDIES: Oxygen Saturation is 100% on RA, normal by my interpretation.    COORDINATION OF CARE: 12:01 PM-Discussed treatment plan which includes CT abdomen, urinalysis, CBC with differential, BMP, and lipase with pt at bedside and pt agreed to plan.   Labs Review Labs Reviewed  BASIC METABOLIC PANEL - Abnormal; Notable for the following:    Glucose, Bld 109 (*)    BUN 36 (*)    Creatinine, Ser 1.47 (*)    Calcium 8.6 (*)    GFR calc non Af Amer 49 (*)    GFR calc Af Amer 56 (*)    All other components within normal limits  C DIFFICILE QUICK SCREEN W PCR REFLEX  GASTROINTESTINAL PANEL BY PCR, STOOL (REPLACES STOOL CULTURE)  URINALYSIS, ROUTINE W REFLEX MICROSCOPIC (NOT AT [facility])  CBC WITH DIFFERENTIAL/PLATELET  LIPASE, BLOOD  COMPREHENSIVE METABOLIC PANEL  CBC    Imaging Review Ct Abdomen Pelvis W Contrast  05/27/2015  CLINICAL DATA:  Nausea, vomiting, and upper mid abdominal pain for 4 days with diarrhea, history small bowel obstruction, hypertension, kidney stones EXAM: CT ABDOMEN AND PELVIS WITH CONTRAST TECHNIQUE: Multidetector CT imaging of the abdomen and pelvis was performed  using the standard protocol following bolus administration of intravenous contrast. Sagittal and coronal MPR images reconstructed from axial data set. CONTRAST:  168mL OMNIPAQUE IOHEXOL 300 MG/ML SOLN IV. Dilute oral contrast. COMPARISON:  07/06/2012 FINDINGS: Subsegmental atelectasis BILATERAL lower lobes. Gallbladder surgically absent. Liver, spleen, pancreas, and adrenal glands normal. BILATERAL nonobstructing renal calculi and peripelvic renal cysts. Appendix surgically absent. Dilated proximal and decompressed distal small bowel loops compatible with small bowel obstruction. Few small bowel loops demonstrate mild wall thickening. Transition zone questionably in LEFT mid abdomen. Colon decompressed. Unremarkable bladder, ureters and prostate gland. Tiny BILATERAL inguinal hernias containing fat. Scattered normal sized mesenteric nodes. No mass, adenopathy, free air or free fluid. No acute osseous findings. IMPRESSION: Mid small bowel obstruction with transition zone in the LEFT mid abdomen. A few of the small bowel loops demonstrate wall thickening, which could reflect artifacts from incomplete  distention versus true wall thickening due to infection, inflammatory bowel disease, or ischemia. BILATERAL renal cysts and nonobstructing renal calculi. Electronically Signed   By: Lavonia Dana M.D.   On: 05/27/2015 13:20   I have personally reviewed and evaluated these images and lab results as part of my medical decision-making.   EKG Interpretation None      MDM   Final diagnoses:  Small bowel obstruction (HCC)  Dehydration   Hx recurrent small bowel obstructions, now with improving abdominal pain and obstipation.  Vomiting has subsided.  Started passing gas and diarrhea this AM. No fever.  Abdomen soft, epigastric tenderness. IVF, imaging to assess for obstruction.  CT shows SBO with transition point.  D/w [name].  Recommends IVF, bowel rest and medical admission.  No NG at this time as  vomiting has resolved. Admission dw Dr. Caryn Section.  I personally performed the services described in this documentation, which was scribed in my presence. The recorded information has been reviewed and is accurate.  Ezequiel Essex, MD 05/27/15 1755

## 2015-05-27 NOTE — ED Notes (Signed)
Pt c/o n/v/abd pain since Thursday. Pt reports green diarrhea today.

## 2015-05-27 NOTE — ED Notes (Signed)
MD at bedside. 

## 2015-05-27 NOTE — H&P (Signed)
[facility] History and Physical  [name] [id number] DOB: [birth year] DOA: 05/27/2015  Referring physician: ED physician, Dr. Wyvonnia Dusky PCP: [name], MD   Chief Complaint: Abdominal pain, nausea, and vomiting.  HPI: Justin [name] is a 65 y.o. male with a history of hypertension, IBS, appendectomy, cholecystectomy, and multiple small bowel obstructions, who presents to the ED with a chief complaint of epigastric abdominal pain, nausea, and vomiting. His symptoms started approximately 4 days ago. He began having epigastric abdominal pain after eating dinner on 05/23/2015. This was followed by several episodes of emesis, none with coffee grounds consistency, but no visible bright red blood in his emesis. At one point, he felt that the emesis was mostly bilious. He also began having several loose stools over the past 24 hours, green in color and watery. He denies black tarry stools or bright red blood in his stools. He denies any recent travel or antibiotic therapy. He has eaten very little due to the pain. He tried drinking water yesterday, but after drinking it, he felt as if his abdomen was swelling. He denies pain with urination. He complains of being thirsty and feels as if he is dehydrated.  In the ED, he was afebrile and hemodynamically stable, but his blood pressure was on the low-normal size. His lab data are significant for a BUN of 36, creatinine of 1.47, and glucose of 109. His lipase was within normal limits. LFTs not ordered. His urinalysis was unremarkable. CT of his abdomen and pelvis with contrast revealed mid small bowel obstruction with transition zone in the left mid abdomen; few small bowel loops demonstrate wall thickening which could reflect artifact from incomplete distention versus true wall thickening due to infection, inflammatory bowel disease, or ischemia; bilateral renal cyst and nonobstructing renal calculi. Patient is being admitted for further  evaluation and management.     Review of Systems:  Review of systems as above in history present illness. Otherwise review of systems is negative.  Past Medical History  Diagnosis Date  . Small bowel obstruction ([place])   . Acid reflux   . Hypertension   . IBS (irritable bowel syndrome)   . Kidney stones    Past Surgical History  Procedure Laterality Date  . Knee surgery  1980's.    bilateral  . Cholecystectomy  2003  . Colonoscopy  June 2004    Dr. [name]: normal  . Exploratory laparotomy      remote past  . Appendectomy    . Colonoscopy N/A 08/31/2012    Procedure: COLONOSCOPY;  Surgeon: [name], MD;  Location: [facility];  Service: Endoscopy;  Laterality: N/A;  8:30   Social History: He is married. He is employed. He denies tobacco, alcohol, and illicit drug use.   No Known Allergies  Family History  Problem Relation Age of Onset  . Cancer Sister     Breast  . Colon cancer Neg Hx   Mother died of complications from a brain tumor. His father died of old age.  Prior to Admission medications   Medication Sig Start Date End Date Taking? Authorizing Provider  albuterol (PROVENTIL HFA;VENTOLIN HFA) 108 (90 Base) MCG/ACT inhaler Inhale 2 puffs into the lungs every 6 (six) hours as needed for wheezing or shortness of breath.   Yes Historical Provider, MD  ALPRAZolam Duanne Moron) 1 MG tablet Take 1 mg by mouth at bedtime as needed for sleep.  06/06/12  Yes Historical Provider, MD  colchicine 0.6 MG tablet Take 1  tablet by mouth daily as needed (gout flare).  01/30/15  Yes Historical Provider, MD  esomeprazole (NEXIUM) 40 MG capsule Take 40 mg by mouth at bedtime.    Yes Historical Provider, MD  losartan (COZAAR) 100 MG tablet Take 100 mg by mouth daily.   Yes Historical Provider, MD  meloxicam (MOBIC) 15 MG tablet Take 15 mg by mouth daily as needed for pain.  11/19/13  Yes Historical Provider, MD  nebivolol (BYSTOLIC) 10 MG tablet Take 10 mg by mouth daily.   Yes Historical  Provider, MD   Physical Exam: Filed Vitals:   05/27/15 1126 05/27/15 1554  BP: 124/81 109/59  Pulse: 72 60  Temp: 98 F (36.7 C) 97.9 F (36.6 C)  TempSrc: Oral Oral  Resp: 16 16  Height: 6' (1.829 m) 6' (1.829 m)  Weight: 98.884 kg (218 lb)   SpO2: 100% 100%    Wt Readings from Last 3 Encounters:  05/27/15 98.884 kg (218 lb)  05/26/14 99.791 kg (220 lb)  12/26/13 102.059 kg (225 lb)    General:  Appears calm and comfortable; 65 year old Caucasian man lying in bed, in no acute distress. Eyes: PERRL, normal lids, irises & conjunctiva; conjunctivae are clear and sclerae are white. ENT: grossly normal hearing; oropharynx reveals dry mucous membranes; no posterior pharyngeal exudates or erythema. Neck: no LAD, masses or thyromegaly Cardiovascular: RRR, no m/r/g. No LE edema. Telemetry: Not applicable Respiratory: CTA bilaterally, no w/r/r. Normal respiratory effort. Abdomen: Mildly obese, hypoactive bowel sounds, soft, mildly tender in the epigastrium; no masses palpated; no rigidity. Skin: no rash or induration seen on limited exam Musculoskeletal: grossly normal tone BUE/BLE Psychiatric: grossly normal mood and affect, speech fluent and appropriate Neurologic: grossly non-focal; cranial nerves II through XII are intact.           Labs on Admission:  Basic Metabolic Panel:  Recent Labs Lab 05/27/15 1141  NA 140  K 3.5  CL 105  CO2 26  GLUCOSE 109*  BUN 36*  CREATININE 1.47*  CALCIUM 8.6*   Liver Function Tests: No results for input(s): AST, ALT, ALKPHOS, BILITOT, PROT, ALBUMIN in the last 168 hours.  Recent Labs Lab 05/27/15 1158  LIPASE 45   No results for input(s): AMMONIA in the last 168 hours. CBC:  Recent Labs Lab 05/27/15 1141  WBC 5.7  NEUTROABS 3.4  HGB 15.2  HCT 43.2  MCV 87.3  PLT 173   Cardiac Enzymes: No results for input(s): CKTOTAL, CKMB, CKMBINDEX, TROPONINI in the last 168 hours.  BNP (last 3 results) No results for input(s):  BNP in the last 8760 hours.  ProBNP (last 3 results) No results for input(s): PROBNP in the last 8760 hours.  CBG: No results for input(s): GLUCAP in the last 168 hours.  Radiological Exams on Admission: Ct Abdomen Pelvis W Contrast  05/27/2015  CLINICAL DATA:  Nausea, vomiting, and upper mid abdominal pain for 4 days with diarrhea, history small bowel obstruction, hypertension, kidney stones EXAM: CT ABDOMEN AND PELVIS WITH CONTRAST TECHNIQUE: Multidetector CT imaging of the abdomen and pelvis was performed using the standard protocol following bolus administration of intravenous contrast. Sagittal and coronal MPR images reconstructed from axial data set. CONTRAST:  130mL OMNIPAQUE IOHEXOL 300 MG/ML SOLN IV. Dilute oral contrast. COMPARISON:  07/06/2012 FINDINGS: Subsegmental atelectasis BILATERAL lower lobes. Gallbladder surgically absent. Liver, spleen, pancreas, and adrenal glands normal. BILATERAL nonobstructing renal calculi and peripelvic renal cysts. Appendix surgically absent. Dilated proximal and decompressed distal small bowel loops compatible with  small bowel obstruction. Few small bowel loops demonstrate mild wall thickening. Transition zone questionably in LEFT mid abdomen. Colon decompressed. Unremarkable bladder, ureters and prostate gland. Tiny BILATERAL inguinal hernias containing fat. Scattered normal sized mesenteric nodes. No mass, adenopathy, free air or free fluid. No acute osseous findings. IMPRESSION: Mid small bowel obstruction with transition zone in the LEFT mid abdomen. A few of the small bowel loops demonstrate wall thickening, which could reflect artifacts from incomplete distention versus true wall thickening due to infection, inflammatory bowel disease, or ischemia. BILATERAL renal cysts and nonobstructing renal calculi. Electronically Signed   By: Lavonia Dana M.D.   On: 05/27/2015 13:20    EKG: Not ordered.  Assessment/Plan Principal Problem:   Small bowel  obstruction ([facility]) Active Problems:   Nausea and vomiting   Diarrhea   Acute kidney injury ([place])   Abdominal pain, epigastric   Essential hypertension    1. Small bowel obstruction. CT scan of the abdomen and pelvis with contrast is noted for small bowel obstruction with transition zone in the left mid abdomen. Patient is afebrile and his white blood cell count is within normal limits. He does not appear to be toxic. Vomiting has subsided, but he still has nausea and abdominal pain. He has a history of small bowel obstructions in the past with one exploratory laparotomy remotely, but he reports that his last small bowel obstruction was treated medically. -Patient was started on IV fluids for hydration. We'll add dextrose as the patient has not eaten in 3 days. -NG tube insertion was contemplated, but the patient is not actively vomiting. -General surgeon, [name] was consulted. He will follow along with [place] for medical management. -We'll start Zofran scheduled, IV every 6 hours. We'll start PPI with Protonix IV every 24 hours. As needed IV hydromorphone will be ordered for pain. -We'll keep the patient virtually nothing by mouth.  Small bowel wall thickening Per radiology, this could be secondary to incomplete distention versus true wall thickening. He is afebrile and his white blood cell count is within normal limits, so will not start antibiotics empirically.  Diarrhea. The patient appears to be having loose stools around the obstruction. Nevertheless, GI pathogen panel including C. difficile PCR will be ordered.  Essential hypertension. Patient is treated chronically with losartan and Bystolic. His blood pressures on the lower side of normal, likely from hypovolemia. We'll hold his antihypertensive medications for now.  Acute kidney injury. Patient's creatinine is 1.47 his BUN is 36. Baseline creatinine is unknown, but a few years ago, it was within normal limits. This is presumed  acute kidney injury from prerenal azotemia. As above, will start IV fluids for hydration. We'll continue to monitor his renal function.      Code Status: Full code DVT Prophylaxis: Lovenox Family Communication: Discussed with patient; family not available Disposition Plan: Discharge with clinically appropriate, possibly in a couple days.   Time spent: One hour  [facility] Pager [phone]

## 2015-05-28 DIAGNOSIS — E860 Dehydration: Secondary | ICD-10-CM

## 2015-05-28 DIAGNOSIS — D696 Thrombocytopenia, unspecified: Secondary | ICD-10-CM | POA: Diagnosis not present

## 2015-05-28 LAB — COMPREHENSIVE METABOLIC PANEL
ALBUMIN: 3 g/dL — AB (ref 3.5–5.0)
ALK PHOS: 37 U/L — AB (ref 38–126)
ALT: 18 U/L (ref 17–63)
ANION GAP: 6 (ref 5–15)
AST: 16 U/L (ref 15–41)
BUN: 22 mg/dL — ABNORMAL HIGH (ref 6–20)
CALCIUM: 8 mg/dL — AB (ref 8.9–10.3)
CHLORIDE: 111 mmol/L (ref 101–111)
CO2: 25 mmol/L (ref 22–32)
CREATININE: 1.24 mg/dL (ref 0.61–1.24)
GFR calc non Af Amer: 60 mL/min — ABNORMAL LOW (ref >60)
GLUCOSE: 104 mg/dL — AB (ref 65–99)
Potassium: 3.6 mmol/L (ref 3.5–5.1)
SODIUM: 142 mmol/L (ref 135–145)
Total Bilirubin: 0.5 mg/dL (ref 0.3–1.2)
Total Protein: 5.5 g/dL — ABNORMAL LOW (ref 6.5–8.1)

## 2015-05-28 LAB — CBC
HCT: 37 % — ABNORMAL LOW (ref 39.0–52.0)
HEMOGLOBIN: 12.9 g/dL — AB (ref 13.0–17.0)
MCH: 30.5 pg (ref 26.0–34.0)
MCHC: 34.9 g/dL (ref 30.0–36.0)
MCV: 87.5 fL (ref 78.0–100.0)
Platelets: 144 10*3/uL — ABNORMAL LOW (ref 150–400)
RBC: 4.23 MIL/uL (ref 4.22–5.81)
RDW: 12.8 % (ref 11.5–15.5)
WBC: 4 10*3/uL (ref 4.0–10.5)

## 2015-05-28 MED ORDER — NEBIVOLOL HCL 10 MG PO TABS: 10.0000 mg | ORAL_TABLET | Freq: Every day | ORAL | Status: DC

## 2015-05-28 MED ORDER — LOSARTAN POTASSIUM 100 MG PO TABS: 100.0000 mg | ORAL_TABLET | Freq: Every day | ORAL | Status: DC

## 2015-05-28 NOTE — Discharge Summary (Signed)
Physician Discharge Summary  Justin [name] [id number] DOB: 07-Jun-1950 DOA: 05/27/2015  PCP: [name], MD  Admit date: 05/27/2015 Discharge date: 05/28/2015  Time spent: Greater than 30 minutes  Recommendations for Outpatient Follow-up:  1. Patient instructed to follow a soft and/or full liquid diet for the next few days and to follow-up with [name] as needed.    Discharge Diagnoses:  1. Partial small bowel obstruction. Resolved with medical management. 2. Small bowel wall thickening radiographically. 3. Diarrhea, resolved. 4. Essential hypertension. 5. Acute kidney injury, secondary to prerenal azotemia and dehydration. Resolved. 6. Dilutional thrombocytopenia and anemia. 7. Obesity.   Discharge Condition: Improved  Diet recommendation: Soft and/or full liquids for several days then advance as tolerated.  Filed Weights   05/27/15 1126 05/28/15 0700  Weight: 98.884 kg (218 lb) 102.921 kg (226 lb 14.4 oz)    History of present illness:  Patient is a 65 year old male with a history of hypertension, IBS, appendectomy, cholecystectomy, and multiple small bowel obstructions, who presented to the ED on 05/27/2015 with a complaint of epigastric abdominal pain, nausea, and vomiting, and feeling dehydrated. He also had some loose stools. In the ED, he was afebrile and hemodynamically stable, but his blood pressure was on the low-normal size. His lab data were significant for a BUN of 36, creatinine of 1.47, and glucose of 109. His lipase was within normal limits. LFTs not ordered. His urinalysis was unremarkable. CT of his abdomen and pelvis with contrast revealed mid small bowel obstruction with transition zone in the left mid abdomen; few small bowel loops demonstrate wall thickening which could reflect artifact from incomplete distention versus true wall thickening due to infection, inflammatory bowel disease, or ischemia; bilateral renal cyst and nonobstructing renal calculi.  Patient was admitted for further evaluation and management.   Hospital Course:  The patient was started on IV fluids for hydration and he was made to be virtually nothing by mouth. NG tube was not inserted as the patient had no ongoing vomiting. Zofran was ordered scheduled IV every 6 hours. IV Protonix was added empirically. IV hydromorphone was given as needed for pain. General surgeon, [name] was consulted. He agreed with medical management. He did not believe the patient needed surgical intervention. The patient reported "diarrhea" at home, but he had no diarrhea during the hospitalization. It was felt that he was probably stooling around the obstruction.  The patient improved clinically and symptomatically. Follow-up evaluation revealed no abdominal tenderness. [name] advanced his diet which he tolerated well. Patient's BUN and creatinine improved. However, with IV fluid hydration, he had a modest decrease in his platelet count and hemoglobin. This was felt to be inconsequential. There was no evidence of GI bleeding. Patient was discharged to home in improved and stable condition.  Procedures:  None  Consultations:  Gen. surgery, [name]  Discharge Exam: Filed Vitals:   05/27/15 2153 05/28/15 0700  BP: 103/56 121/68  Pulse: 69 60  Temp: 98.1 F (36.7 C) 98 F (36.7 C)  Resp: 18 18    General: Pleasant 65 year old man in no acute distress. Cardiovascular: S1, S2, with a soft systolic murmur and borderline bradycardia. Respiratory: Clear to auscultation bilaterally. Abdomen: Positive bowel sounds, obese, nontender, nondistended.  Discharge Instructions   Discharge Instructions    Diet - low sodium heart healthy    Complete by:  As directed      Discharge instructions    Complete by:  As directed   Follow a full liquid  or soft diet for the next 3-5 days and then gradually increase to more solid foods.     Increase activity slowly    Complete by:  As directed            Current Discharge Medication List    CONTINUE these medications which have CHANGED   Details  losartan (COZAAR) 100 MG tablet Take 1 tablet (100 mg total) by mouth daily. Restart in 2 days.    nebivolol (BYSTOLIC) 10 MG tablet Take 1 tablet (10 mg total) by mouth daily. Restart in 2 days.      CONTINUE these medications which have NOT CHANGED   Details  albuterol (PROVENTIL HFA;VENTOLIN HFA) 108 (90 Base) MCG/ACT inhaler Inhale 2 puffs into the lungs every 6 (six) hours as needed for wheezing or shortness of breath.    ALPRAZolam (XANAX) 1 MG tablet Take 1 mg by mouth at bedtime as needed for sleep.     colchicine 0.6 MG tablet Take 1 tablet by mouth daily as needed (gout flare).     esomeprazole (NEXIUM) 40 MG capsule Take 40 mg by mouth at bedtime.     meloxicam (MOBIC) 15 MG tablet Take 15 mg by mouth daily as needed for pain.  Refills: 0       No Known Allergies Follow-up Information    Follow up with Jamesetta So, MD.   Specialty:  General Surgery   Why:  As needed   Contact information:   [address] [place] [id number] [phone]       Follow up with [name], MD In 2 weeks.   Specialty:  Internal Medicine   Contact information:   [address] [place] [id number] [phone]        The results of significant diagnostics from this hospitalization (including imaging, microbiology, ancillary and laboratory) are listed below for reference.    Significant Diagnostic Studies: Ct Abdomen Pelvis W Contrast  05/27/2015  CLINICAL DATA:  Nausea, vomiting, and upper mid abdominal pain for 4 days with diarrhea, history small bowel obstruction, hypertension, kidney stones EXAM: CT ABDOMEN AND PELVIS WITH CONTRAST TECHNIQUE: Multidetector CT imaging of the abdomen and pelvis was performed using the standard protocol following bolus administration of intravenous contrast. Sagittal and coronal MPR images reconstructed from axial  data set. CONTRAST:  132mL OMNIPAQUE IOHEXOL 300 MG/ML SOLN IV. Dilute oral contrast. COMPARISON:  07/06/2012 FINDINGS: Subsegmental atelectasis BILATERAL lower lobes. Gallbladder surgically absent. Liver, spleen, pancreas, and adrenal glands normal. BILATERAL nonobstructing renal calculi and peripelvic renal cysts. Appendix surgically absent. Dilated proximal and decompressed distal small bowel loops compatible with small bowel obstruction. Few small bowel loops demonstrate mild wall thickening. Transition zone questionably in LEFT mid abdomen. Colon decompressed. Unremarkable bladder, ureters and prostate gland. Tiny BILATERAL inguinal hernias containing fat. Scattered normal sized mesenteric nodes. No mass, adenopathy, free air or free fluid. No acute osseous findings. IMPRESSION: Mid small bowel obstruction with transition zone in the LEFT mid abdomen. A few of the small bowel loops demonstrate wall thickening, which could reflect artifacts from incomplete distention versus true wall thickening due to infection, inflammatory bowel disease, or ischemia. BILATERAL renal cysts and nonobstructing renal calculi. Electronically Signed   By: Lavonia Dana M.D.   On: 05/27/2015 13:20    Microbiology: No results found for this or any previous visit (from the past 240 hour(s)).   Labs: Basic Metabolic Panel:  Recent Labs Lab 05/27/15 1141 05/28/15 0550  NA 140 142  K 3.5 3.6  CL 105 111  CO2 26 25  GLUCOSE 109* 104*  BUN 36* 22*  CREATININE 1.47* 1.24  CALCIUM 8.6* 8.0*   Liver Function Tests:  Recent Labs Lab 05/28/15 0550  AST 16  ALT 18  ALKPHOS 37*  BILITOT 0.5  PROT 5.5*  ALBUMIN 3.0*    Recent Labs Lab 05/27/15 1158  LIPASE 45   No results for input(s): AMMONIA in the last 168 hours. CBC:  Recent Labs Lab 05/27/15 1141 05/28/15 0550  WBC 5.7 4.0  NEUTROABS 3.4  --   HGB 15.2 12.9*  HCT 43.2 37.0*  MCV 87.3 87.5  PLT 173 144*   Cardiac Enzymes: No results for  input(s): CKTOTAL, CKMB, CKMBINDEX, TROPONINI in the last 168 hours. BNP: BNP (last 3 results) No results for input(s): BNP in the last 8760 hours.  ProBNP (last 3 results) No results for input(s): PROBNP in the last 8760 hours.  CBG: No results for input(s): GLUCAP in the last 168 hours.     Signed:  Marliss Buttacavoli MD.  [facility] 05/28/2015, 11:43 AM

## 2015-05-28 NOTE — Consult Note (Signed)
Reason for Consult: Partial small bowel obstruction Referring Physician: Hospitalist  Justin [name] is an 65 y.o. male.  HPI: Justin [name] is a 65 year old white male who presented emergency room with nausea and vomiting. CT scan the abdomen revealed dilated loops of small bowel, consistent with adhesive disease. Justin [name] states that he has had similar episodes in the past. He was starting to pass gas and move his bowels, but he felt that he was dehydrated because he could not maintain by mouth intake. He been told in the past by Justin [name] to avoid surgical intervention if at all possible. He was admitted to the hospital for further evaluation treatment. Overnight, he continues to move his bowels and feels much better. He states the IV hydration has helped him greatly and he would like to go home.  Past Medical History  Diagnosis Date  . Small bowel obstruction ([place])   . Acid reflux   . Hypertension   . IBS (irritable bowel syndrome)   . Kidney stones     Past Surgical History  Procedure Laterality Date  . Knee surgery  1980's.    bilateral  . Cholecystectomy  2003  . Colonoscopy  June 2004    Justin [name]: normal  . Exploratory laparotomy      remote past  . Appendectomy    . Colonoscopy N/A 08/31/2012    Procedure: COLONOSCOPY;  Surgeon: Justin Dolin, MD;  [facility];  Service: Endoscopy;  Laterality: N/A;  8:30    Family History  Problem Relation Age of Onset  . Cancer Sister     Breast  . Colon cancer Neg Hx     Social History:  reports that he has never smoked. He quit smokeless tobacco use about 3 years ago. His smokeless tobacco use included Chew. He reports that he does not drink alcohol or use illicit drugs.  Allergies: No Known Allergies  Medications: I have reviewed the Justin [name]'s current medications.  Results for orders placed or performed during the hospital encounter of 05/27/15 (from the past 48 hour(s))  CBC with Differential     Status: None    Collection Time: 05/27/15 11:41 AM  Result Value Ref Range   WBC 5.7 4.0 - 10.5 K/uL   RBC 4.95 4.22 - 5.81 MIL/uL   Hemoglobin 15.2 13.0 - 17.0 g/dL   HCT 43.2 39.0 - 52.0 %   MCV 87.3 78.0 - 100.0 fL   MCH 30.7 26.0 - 34.0 pg   MCHC 35.2 30.0 - 36.0 g/dL   RDW 12.8 11.5 - 15.5 %   Platelets 173 150 - 400 K/uL   Neutrophils Relative % 60 %   Neutro Abs 3.4 1.7 - 7.7 K/uL   Lymphocytes Relative 25 %   Lymphs Abs 1.4 0.7 - 4.0 K/uL   Monocytes Relative 13 %   Monocytes Absolute 0.7 0.1 - 1.0 K/uL   Eosinophils Relative 2 %   Eosinophils Absolute 0.1 0.0 - 0.7 K/uL   Basophils Relative 0 %   Basophils Absolute 0.0 0.0 - 0.1 K/uL  Basic metabolic panel     Status: Abnormal   Collection Time: 05/27/15 11:41 AM  Result Value Ref Range   Sodium 140 135 - 145 mmol/L   Potassium 3.5 3.5 - 5.1 mmol/L   Chloride 105 101 - 111 mmol/L   CO2 26 22 - 32 mmol/L   Glucose, Bld 109 (H) 65 - 99 mg/dL   BUN 36 (H) 6 - 20 mg/dL  Creatinine, Ser 1.47 (H) 0.61 - 1.24 mg/dL   Calcium 8.6 (L) 8.9 - 10.3 mg/dL   GFR calc non Af Amer 49 (L) >60 mL/min   GFR calc Af Amer 56 (L) >60 mL/min    Comment: (NOTE) The eGFR has been calculated using the CKD EPI equation. This calculation has not been validated in all clinical situations. eGFR's persistently <60 mL/min signify possible Chronic Kidney Disease.    Anion gap 9 5 - 15  Lipase, blood     Status: None   Collection Time: 05/27/15 11:58 AM  Result Value Ref Range   Lipase 45 11 - 51 U/L  Urinalysis, Routine w reflex microscopic (not at [facility])     Status: None   Collection Time: 05/27/15  2:30 PM  Result Value Ref Range   Color, Urine YELLOW YELLOW   APPearance CLEAR CLEAR   Specific Gravity, Urine 1.010 1.005 - 1.030   pH 6.5 5.0 - 8.0   Glucose, UA NEGATIVE NEGATIVE mg/dL   Hgb urine dipstick NEGATIVE NEGATIVE   Bilirubin Urine NEGATIVE NEGATIVE   Ketones, ur NEGATIVE NEGATIVE mg/dL   Protein, ur NEGATIVE NEGATIVE mg/dL   Nitrite  NEGATIVE NEGATIVE   Leukocytes, UA NEGATIVE NEGATIVE    Comment: MICROSCOPIC NOT DONE ON URINES WITH NEGATIVE PROTEIN, BLOOD, LEUKOCYTES, NITRITE, OR GLUCOSE <1000 mg/dL.  Comprehensive metabolic panel     Status: Abnormal   Collection Time: 05/28/15  5:50 AM  Result Value Ref Range   Sodium 142 135 - 145 mmol/L   Potassium 3.6 3.5 - 5.1 mmol/L   Chloride 111 101 - 111 mmol/L   CO2 25 22 - 32 mmol/L   Glucose, Bld 104 (H) 65 - 99 mg/dL   BUN 22 (H) 6 - 20 mg/dL   Creatinine, Ser 1.24 0.61 - 1.24 mg/dL   Calcium 8.0 (L) 8.9 - 10.3 mg/dL   Total Protein 5.5 (L) 6.5 - 8.1 g/dL   Albumin 3.0 (L) 3.5 - 5.0 g/dL   AST 16 15 - 41 U/L   ALT 18 17 - 63 U/L   Alkaline Phosphatase 37 (L) 38 - 126 U/L   Total Bilirubin 0.5 0.3 - 1.2 mg/dL   GFR calc non Af Amer 60 (L) >60 mL/min   GFR calc Af Amer >60 >60 mL/min    Comment: (NOTE) The eGFR has been calculated using the CKD EPI equation. This calculation has not been validated in all clinical situations. eGFR's persistently <60 mL/min signify possible Chronic Kidney Disease.    Anion gap 6 5 - 15  CBC     Status: Abnormal   Collection Time: 05/28/15  5:50 AM  Result Value Ref Range   WBC 4.0 4.0 - 10.5 K/uL   RBC 4.23 4.22 - 5.81 MIL/uL   Hemoglobin 12.9 (L) 13.0 - 17.0 g/dL   HCT 37.0 (L) 39.0 - 52.0 %   MCV 87.5 78.0 - 100.0 fL   MCH 30.5 26.0 - 34.0 pg   MCHC 34.9 30.0 - 36.0 g/dL   RDW 12.8 11.5 - 15.5 %   Platelets 144 (L) 150 - 400 K/uL    Ct Abdomen Pelvis W Contrast  05/27/2015  CLINICAL DATA:  Nausea, vomiting, and upper mid abdominal pain for 4 days with diarrhea, history small bowel obstruction, hypertension, kidney stones EXAM: CT ABDOMEN AND PELVIS WITH CONTRAST TECHNIQUE: Multidetector CT imaging of the abdomen and pelvis was performed using the standard protocol following bolus administration of intravenous contrast. Sagittal and coronal MPR  images reconstructed from axial data set. CONTRAST:  172m OMNIPAQUE IOHEXOL 300  MG/ML SOLN IV. Dilute oral contrast. COMPARISON:  07/06/2012 FINDINGS: Subsegmental atelectasis BILATERAL lower lobes. Gallbladder surgically absent. Liver, spleen, pancreas, and adrenal glands normal. BILATERAL nonobstructing renal calculi and peripelvic renal cysts. Appendix surgically absent. Dilated proximal and decompressed distal small bowel loops compatible with small bowel obstruction. Few small bowel loops demonstrate mild wall thickening. Transition zone questionably in LEFT mid abdomen. Colon decompressed. Unremarkable bladder, ureters and prostate gland. Tiny BILATERAL inguinal hernias containing fat. Scattered normal sized mesenteric nodes. No mass, adenopathy, free air or free fluid. No acute osseous findings. IMPRESSION: Mid small bowel obstruction with transition zone in the LEFT mid abdomen. A few of the small bowel loops demonstrate wall thickening, which could reflect artifacts from incomplete distention versus true wall thickening due to infection, inflammatory bowel disease, or ischemia. BILATERAL renal cysts and nonobstructing renal calculi. Electronically Signed   By: MLavonia DanaM.D.   On: 05/27/2015 13:20    ROS: See chart Blood pressure 121/68, pulse 60, temperature 98 F (36.7 C), temperature source Oral, resp. rate 18, height 6' (1.829 m), weight 102.921 kg (226 lb 14.4 oz), SpO2 100 %. Physical Exam: Pleasant white male in no acute distress. Abdomen is soft, nontender, nondistended. No hepatosplenomegaly, masses, hernias identified.  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 Renal insufficiency, improved. Plan: We'll advance to full liquid diet. No need for surgical intervention. Okay with discharge from surgery standpoint.  Teila Skalsky A 05/28/2015, 7:50 AM

## 2015-11-04 ENCOUNTER — Emergency Department (HOSPITAL_COMMUNITY)
Admission: EM | Admit: 2015-11-04 | Discharge: 2015-11-04 | Disposition: A | Discharge status: Home / Self Care | Payer: Managed Care, Other (non HMO) | Attending: Emergency Medicine | Admitting: Emergency Medicine

## 2015-11-04 ENCOUNTER — Emergency Department (HOSPITAL_COMMUNITY): Payer: Managed Care, Other (non HMO)

## 2015-11-04 ENCOUNTER — Encounter (HOSPITAL_COMMUNITY): Payer: Self-pay

## 2015-11-04 DIAGNOSIS — R112 Nausea with vomiting, unspecified: Secondary | ICD-10-CM | POA: Diagnosis not present

## 2015-11-04 DIAGNOSIS — I10 Essential (primary) hypertension: Secondary | ICD-10-CM | POA: Diagnosis not present

## 2015-11-04 DIAGNOSIS — R197 Diarrhea, unspecified: Secondary | ICD-10-CM | POA: Insufficient documentation

## 2015-11-04 DIAGNOSIS — R109 Unspecified abdominal pain: Secondary | ICD-10-CM

## 2015-11-04 DIAGNOSIS — R1010 Upper abdominal pain, unspecified: Secondary | ICD-10-CM | POA: Diagnosis present

## 2015-11-04 LAB — COMPREHENSIVE METABOLIC PANEL
ALBUMIN: 4 g/dL (ref 3.5–5.0)
ALT: 22 U/L (ref 17–63)
ANION GAP: 10 (ref 5–15)
AST: 25 U/L (ref 15–41)
Alkaline Phosphatase: 57 U/L (ref 38–126)
BILIRUBIN TOTAL: 1.7 mg/dL — AB (ref 0.3–1.2)
BUN: 24 mg/dL — AB (ref 6–20)
CO2: 28 mmol/L (ref 22–32)
Calcium: 9.4 mg/dL (ref 8.9–10.3)
Chloride: 104 mmol/L (ref 101–111)
Creatinine, Ser: 1.45 mg/dL — ABNORMAL HIGH (ref 0.61–1.24)
GFR calc Af Amer: 57 mL/min — ABNORMAL LOW (ref >60)
GFR calc non Af Amer: 49 mL/min — ABNORMAL LOW (ref >60)
GLUCOSE: 121 mg/dL — AB (ref 65–99)
POTASSIUM: 4 mmol/L (ref 3.5–5.1)
SODIUM: 142 mmol/L (ref 135–145)
TOTAL PROTEIN: 7 g/dL (ref 6.5–8.1)

## 2015-11-04 LAB — URINALYSIS, ROUTINE W REFLEX MICROSCOPIC
GLUCOSE, UA: NEGATIVE mg/dL
HGB URINE DIPSTICK: NEGATIVE
KETONES UR: NEGATIVE mg/dL
Leukocytes, UA: NEGATIVE
Nitrite: NEGATIVE
PH: 5.5 (ref 5.0–8.0)
Protein, ur: NEGATIVE mg/dL

## 2015-11-04 LAB — CBC
HEMATOCRIT: 48.4 % (ref 39.0–52.0)
HEMOGLOBIN: 16.6 g/dL (ref 13.0–17.0)
MCH: 29.9 pg (ref 26.0–34.0)
MCHC: 34.3 g/dL (ref 30.0–36.0)
MCV: 87.2 fL (ref 78.0–100.0)
Platelets: 163 10*3/uL (ref 150–400)
RBC: 5.55 MIL/uL (ref 4.22–5.81)
RDW: 13.5 % (ref 11.5–15.5)
WBC: 6.3 10*3/uL (ref 4.0–10.5)

## 2015-11-04 LAB — LIPASE, BLOOD: Lipase: 19 U/L (ref 11–51)

## 2015-11-04 MED ORDER — IOPAMIDOL (ISOVUE-300) INJECTION 61%
INTRAVENOUS | Status: AC
  Administered 2015-11-04: 100 mL
  Filled 2015-11-04: qty 100

## 2015-11-04 MED ORDER — SODIUM CHLORIDE 0.9 % IV BOLUS (SEPSIS)
1000.0000 mL | Freq: Once | INTRAVENOUS | Status: AC
  Administered 2015-11-04: 1000 mL via INTRAVENOUS

## 2015-11-04 NOTE — ED Provider Notes (Signed)
[place] DEPT Provider Note   CSN: [id number] Arrival date & time: 11/04/15  1011     History   Chief Complaint Chief Complaint  Patient presents with  . Abdominal Pain    HPI Justin [name] is a 65 y.o. male.  65 year old male with past medical history including SBO, hypertension who presents with abdominal pain, vomiting, and diarrhea. The patient states that 2 days ago he began having upper abdominal pain and distention associated with 2 episodes of vomiting yesterday. He had an episode of diarrhea yesterday and another nonbloody episode this morning. He has not wanted to eat anything since yesterday because of his symptoms. He reports that these symptoms are similar to previous episodes of bowel obstruction and he comes today due to concerns that he may be obstructed. No urinary problems, bilious/bloody emesis, fevers, or current abdominal pain.    The history is provided by the patient.  Abdominal Pain      Past Medical History:  Diagnosis Date  . Acid reflux   . Hypertension   . IBS (irritable bowel syndrome)   . Kidney stones   . Small bowel obstruction [facility])     Patient Active Problem List   Diagnosis Date Noted  . Thrombocytopenia ([place]) 05/28/2015  . Dehydration   . Small bowel obstruction ([place]) 05/27/2015  . Abdominal pain, epigastric 05/27/2015  . Nausea and vomiting 05/27/2015  . Diarrhea 05/27/2015  . Essential hypertension 05/27/2015  . SBO (small bowel obstruction) ([place]) 05/27/2015  . Acute kidney injury ([place]) 05/27/2015  . Encounter for screening colonoscopy 08/01/2012  . Fatty liver 08/01/2012  . Abdominal pain, unspecified site 07/12/2012    Past Surgical History:  Procedure Laterality Date  . APPENDECTOMY    . CHOLECYSTECTOMY  2003  . COLONOSCOPY  June 2004   Dr. [name]: normal  . COLONOSCOPY N/A 08/31/2012   Procedure: COLONOSCOPY;  Surgeon: [name], MD;  [facility];  Service: Endoscopy;  Laterality: N/A;  8:30  .  EXPLORATORY LAPAROTOMY     remote past  . KNEE SURGERY  1980's.   bilateral       Home Medications    Prior to Admission medications   Medication Sig Start Date End Date Taking? Authorizing Provider  albuterol (PROVENTIL HFA;VENTOLIN HFA) 108 (90 Base) MCG/ACT inhaler Inhale 2 puffs into the lungs every 6 (six) hours as needed for wheezing or shortness of breath.   Yes Historical Provider, MD  ALPRAZolam Duanne Moron) 1 MG tablet Take 1 mg by mouth at bedtime as needed for sleep.  06/06/12  Yes Historical Provider, MD  colchicine 0.6 MG tablet Take 1 tablet by mouth daily as needed (gout flare).  01/30/15  Yes Historical Provider, MD  esomeprazole (NEXIUM) 40 MG capsule Take 40 mg by mouth at bedtime.    Yes Historical Provider, MD  ibuprofen (ADVIL,MOTRIN) 800 MG tablet Take 800 mg by mouth every 8 (eight) hours as needed for mild pain.   Yes Historical Provider, MD  losartan (COZAAR) 100 MG tablet Take 1 tablet (100 mg total) by mouth daily. Restart in 2 days. 05/28/15  Yes Rexene Alberts, MD  nebivolol (BYSTOLIC) 10 MG tablet Take 1 tablet (10 mg total) by mouth daily. Restart in 2 days. 05/28/15  Yes Rexene Alberts, MD    Family History Family History  Problem Relation Age of Onset  . Cancer Sister     Breast  . Colon cancer Neg Hx     Social History Social History  Substance Use Topics  . Smoking status: Never Smoker  . Smokeless tobacco: Former Systems developer    Types: [place] date: 12/01/2011  . Alcohol use No     Allergies   Review of patient's allergies indicates no known allergies.   Review of Systems Review of Systems  Gastrointestinal: Positive for abdominal pain.   10 Systems reviewed and are negative for acute change except as noted in the HPI.   Physical Exam Updated Vital Signs BP 112/75   Pulse 64   Temp 98.2 F (36.8 C) (Oral)   Resp 16   Ht 6' (1.829 m)   Wt 228 lb (103.4 kg)   SpO2 98%   BMI 30.92 kg/m   Physical Exam  Constitutional: He is oriented  to person, place, and time. He appears well-developed and well-nourished. No distress.  HENT:  Head: Normocephalic and atraumatic.  Moist mucous membranes  Eyes: Conjunctivae are normal. Pupils are equal, round, and reactive to light.  Neck: Neck supple.  Cardiovascular: Normal rate, regular rhythm and normal heart sounds.   No murmur heard. Pulmonary/Chest: Effort normal and breath sounds normal.  Abdominal: Soft. He exhibits no distension. Bowel sounds are decreased. There is no tenderness.  Musculoskeletal: He exhibits no edema.  Neurological: He is alert and oriented to person, place, and time.  Fluent speech  Skin: Skin is warm and dry.  Psychiatric: He has a normal mood and affect. Judgment normal.  Nursing note and vitals reviewed.    ED Treatments / Results  Labs (all labs ordered are listed, but only abnormal results are displayed) Labs Reviewed  COMPREHENSIVE METABOLIC PANEL - Abnormal; Notable for the following:       Result Value   Glucose, Bld 121 (*)    BUN 24 (*)    Creatinine, Ser 1.45 (*)    Total Bilirubin 1.7 (*)    GFR calc non Af Amer 49 (*)    GFR calc Af Amer 57 (*)    All other components within normal limits  URINALYSIS, ROUTINE W REFLEX MICROSCOPIC (NOT AT [facility]) - Abnormal; Notable for the following:    Color, Urine AMBER (*)    Specific Gravity, Urine >1.046 (*)    Bilirubin Urine SMALL (*)    All other components within normal limits  LIPASE, BLOOD  CBC    EKG  EKG Interpretation None       Radiology Ct Abdomen Pelvis W Contrast  Result Date: 11/04/2015 CLINICAL DATA:  Upper abdominal pain and vomiting. History of small bowel obstruction. EXAM: CT ABDOMEN AND PELVIS WITH CONTRAST TECHNIQUE: Multidetector CT imaging of the abdomen and pelvis was performed using the standard protocol following bolus administration of intravenous contrast. CONTRAST:  172mL ISOVUE-300 IOPAMIDOL (ISOVUE-300) INJECTION 61% COMPARISON:  05/27/2015 and 07/06/2012  FINDINGS: Lower chest: Lung bases demonstrate minimal bibasilar linear scarring/atelectasis. Hepatobiliary: Previous cholecystectomy with possible small stone versus sludge containing gallbladder remnant which is unchanged. Mild diffuse low-attenuation of the liver without focal mass. Biliary tree is within normal. Pancreas: Within normal. Spleen: Within normal. Adrenals/Urinary Tract: Adrenal glands are normal and symmetric. Kidneys are normal in size with evidence of right-sided nephrolithiasis as the largest stone measures 7 mm over the mid pole. No left-sided renal stones. Bilateral parapelvic renal cysts. Sub cm hypodensity of the lower pole right kidney unchanged likely a cyst but too small to characterize. Ureters and bladder are within normal. Stomach/Bowel: Stomach is within normal. There are several dilated air and fluid-filled jejunal loops  in the mid to upper abdomen similar to the previous exam and measuring up to 4.3 cm in diameter. There is a gradual transition to normal caliber small bowel. There is mild wall thickening of several mid to distal small bowel loops no focal inflammatory change or free fluid. Terminal ileum is within normal. Appendix is not seen. Colon is mildly decompressed and otherwise within normal. Vascular/Lymphatic: Minimal calcified plaque over the abdominal aorta. No adenopathy. Subtle hazy attenuation of the mid mesentery which may be due to mild mesenteric panniculitis. Reproductive: Within normal. Other: No free fluid. Musculoskeletal: Mild degenerate change of the spine. IMPRESSION: Several air and fluid-filled dilated jejunal loops with somewhat gradual transition to normal caliber distal small bowel. No definite transition point or etiology of obstruction. There is mild wall thickening of several mid to distal small bowel loops. Findings may be due to ileus or intrinsic small-bowel infectious or inflammatory disease and less likely early/ partial obstruction. Previous  cholecystectomy. Possible stone/sludge containing small gallbladder remnant unchanged. Right-sided nephrolithiasis. No obstruction. Bilateral renal cysts. Subtle hazy attenuation of the mid mesentery which may be due to mesenteric panniculitis. Electronically Signed   By: Marin Olp M.D.   On: 11/04/2015 14:19    Procedures Procedures (including critical care time)  Medications Ordered in ED Medications  sodium chloride 0.9 % bolus 1,000 mL (0 mLs Intravenous Stopped 11/04/15 1355)  iopamidol (ISOVUE-300) 61 % injection (100 mLs  Contrast Given 11/04/15 1339)     Initial Impression / Assessment and Plan / ED Course  I have reviewed the triage vital signs and the nursing notes.  Pertinent labs & imaging results that were available during my care of the patient were reviewed by me and considered in my medical decision making (see chart for details).  Clinical Course   Pt w/ h/o SBO p/w abdominal distension followed by vomiting. No current abd pain and abd soft, non-tender on exam. He was well appearing w/ Reassuring vital signs. Labs show creatinine 1.45 which is slightly elevated from baseline. The rest of his labs are reassuring. Patient does state that he has had both vomiting and diarrhea with previous bowel obstructions and because of his significant concerns he wanted a CT to rule out SBO. I discussed risks and benefits; patient voice understanding. CT shows some dilated bowel and wall thickening without obvious bowel obstruction; ddx includes early/partial obstruction, infectious, or inflammatory process. Discussed CT findings with Jerene Pitch, [place] for Dr. Excell Seltzer. He reviewed images and felt that they did not suggest SBO but were more likely infectious in nature. The patient has had no vomiting today and no abdominal pain in the past 24 hours. Because of his well appearance, reassuring lab work, and reassuring abdominal exam, I feel he is safe for discharge. I have discussed supportive care and  emphasized return precautions. Patient to follow-up with PCP as well as his general surgeon. Patient voiced understanding and was discharged in satisfactory condition.  Final Clinical Impressions(s) / ED Diagnoses   Final diagnoses:  Abdominal pain, unspecified abdominal location  Nausea vomiting and diarrhea    New Prescriptions New Prescriptions   No medications on file     Sharlett Iles, MD 11/04/15 1637

## 2015-11-04 NOTE — ED Notes (Signed)
Pt is awear that as soon room opens is going back to see doctor.

## 2015-11-04 NOTE — ED Triage Notes (Signed)
Per Pt Pt is coming from home with complaint of abdominal pain that started on Saturday evening. Pt reports having episodes of diarrhea and an episode of vomiting yesterday. Pt reports not eating for the las two days. Hx of bowel obstruction and reports feeling the same.

## 2015-11-04 NOTE — ED Notes (Signed)
Pt transported to CT ?

## 2015-12-25 ENCOUNTER — Ambulatory Visit: Payer: PRIVATE HEALTH INSURANCE | Admitting: Nurse Practitioner

## 2015-12-31 ENCOUNTER — Encounter: Payer: Self-pay | Admitting: Internal Medicine

## 2015-12-31 ENCOUNTER — Ambulatory Visit (INDEPENDENT_AMBULATORY_CARE_PROVIDER_SITE_OTHER): Payer: Managed Care, Other (non HMO) | Admitting: Internal Medicine

## 2015-12-31 VITALS — BP 107/73 | HR 59 | Temp 97.8°F | Ht 72.0 in | Wt 205.2 lb

## 2015-12-31 DIAGNOSIS — K56609 Unspecified intestinal obstruction, unspecified as to partial versus complete obstruction: Secondary | ICD-10-CM | POA: Diagnosis not present

## 2015-12-31 DIAGNOSIS — K219 Gastro-esophageal reflux disease without esophagitis: Secondary | ICD-10-CM

## 2015-12-31 NOTE — Patient Instructions (Signed)
Information on a low residue diet  Stop Nexium for now;  Trial of Dexilant 60 mg daily - samples - let me know how you are doing  Plan for surveillance colonoscopy in 2019  Office visit in 6 months

## 2015-12-31 NOTE — Progress Notes (Signed)
Primary Care Physician:  [name], MD Primary Gastroenterologist:  Dr. [name]  Pre-Procedure History & Physical: HPI:  Justin [name] is a 65 y.o. male here for follow-up of GERD.  Notes breakthrough symptoms on Nexium. His been able to lose weight changing his diet eating more healthy. No dysphagia. Tells me he's been seen for partial small bowel obstruction 4 this year in [facility]. He see Dr. Lisabeth Register previously. CTE previously negative. No surgery indicated. History of serrated adenomas remembers: Previously; he'll be due for surveillance examination 2019.  Past Medical History:  Diagnosis Date  . Acid reflux   . Hypertension   . IBS (irritable bowel syndrome)   . Kidney stones   . Small bowel obstruction     Past Surgical History:  Procedure Laterality Date  . APPENDECTOMY    . CHOLECYSTECTOMY  2003  . COLONOSCOPY  June 2004   Dr. [name]: normal  . COLONOSCOPY N/A 08/31/2012   Procedure: COLONOSCOPY;  Surgeon: [name], MD;  [facility];  Service: Endoscopy;  Laterality: N/A;  8:30  . EXPLORATORY LAPAROTOMY     remote past  . KNEE SURGERY  1980's.   bilateral    Prior to Admission medications   Medication Sig Start Date End Date Taking? Authorizing Provider  albuterol (PROVENTIL HFA;VENTOLIN HFA) 108 (90 Base) MCG/ACT inhaler Inhale 2 puffs into the lungs every 6 (six) hours as needed for wheezing or shortness of breath.   Yes Historical Provider, MD  ALPRAZolam Duanne Moron) 1 MG tablet Take 1 mg by mouth at bedtime as needed for sleep.  06/06/12  Yes Historical Provider, MD  colchicine 0.6 MG tablet Take 1 tablet by mouth daily as needed (gout flare).  01/30/15  Yes Historical Provider, MD  esomeprazole (NEXIUM) 40 MG capsule Take 40 mg by mouth at bedtime.    Yes Historical Provider, MD  ibuprofen (ADVIL,MOTRIN) 800 MG tablet Take 800 mg by mouth every 8 (eight) hours as needed for mild pain.   Yes Historical Provider, MD  losartan (COZAAR) 100 MG  tablet Take 1 tablet (100 mg total) by mouth daily. Restart in 2 days. Patient taking differently: Take 50 mg by mouth daily. Restart in 2 days. 05/28/15  Yes Rexene Alberts, MD  nebivolol (BYSTOLIC) 10 MG tablet Take 1 tablet (10 mg total) by mouth daily. Restart in 2 days. Patient taking differently: Take 5 mg by mouth daily. Restart in 2 days. 05/28/15  Yes Rexene Alberts, MD    Allergies as of 12/31/2015  . (No Known Allergies)    Family History  Problem Relation Age of Onset  . Cancer Sister     Breast  . Colon cancer Neg Hx     Social History   Social History  . Marital status: Married    Spouse name: Jana Half  . Number of children: 3  . Years of education: 12   Occupational History  . 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 Smoking status: Never Smoker  . Smokeless tobacco: Former Systems developer    Types: [place] date: 12/01/2011  . Alcohol use No  . Drug use: No  . Sexual activity: Not on file   Other Topics Concern  . Not on file   Social History Narrative   Consumes caffeine occas, is left handed    Review of Systems: See HPI,  otherwise negative ROS  Physical Exam: BP 107/73   Pulse (!) 59   Temp 97.8 F (36.6 C) (Oral)   Ht 6' (1.829 m)   Wt 205 lb 3.2 oz (93.1 kg)   BMI 27.83 kg/m  General:   Alert,  Well-developed, well-nourished, pleasant and cooperative in NAD Skin:  Intact without significant lesions or rashes. ENeck:  Supple; no masses or thyromegaly. No significant cervical adenopathy. Lungs:  Clear throughout to auscultation.   No wheezes, crackles, or rhonchi. No acute distress. Heart:  Regular rate and rhythm; no murmurs, clicks, rubs,  or gallops. Abdomen: Non-distended, normal bowel sounds.  Soft and nontender without appreciable mass or hepatosplenomegaly.  Pulses:  Normal pulses noted. Extremities:  Without clubbing or edema.  Impression:   Pleasant 65 year old gentleman long-standing GERD now perceived to be less effectively combat it with Nexium daily. No alarm symptoms. He has accomplished significant weight loss and has been commended. Intermittent low-grade small bowel obstructions continues to be frustrating for him. Prior workup and surgical consultation as outlined above.  Recommendations: Information on a low residue diet  Stop Nexium for now;  Trial of Dexilant 60 mg daily - samples - let me know how you are doing  Plan for surveillance colonoscopy in 2019  Office visit in 6 months     Notice: This dictation was prepared with Dragon dictation along with smaller phrase technology. Any transcriptional errors that result from this process are unintentional and may not be corrected upon review.

## 2016-01-20 ENCOUNTER — Telehealth: Payer: Self-pay | Admitting: Internal Medicine

## 2016-01-20 MED ORDER — DEXLANSOPRAZOLE 60 MG PO CPDR: 60.0000 mg | DELAYED_RELEASE_CAPSULE | Freq: Every day | ORAL | 3 refills | Status: DC

## 2016-01-20 NOTE — Telephone Encounter (Signed)
PATIENT WOULD LIKE A PRESCRIPTION OF DEXILANT 60MG  CALLED INTO HIS PHARMACY, [facility]   STATED THAT THE SAMPLES WORKED WELL

## 2016-01-20 NOTE — Telephone Encounter (Signed)
Done

## 2016-01-20 NOTE — Telephone Encounter (Signed)
Dr.Rourk gave pt dexilant samples. Can we send in an Rx?

## 2016-01-20 NOTE — Addendum Note (Signed)
Addended by: Annitta Needs on: 01/20/2016 03:47 PM   Modules accepted: Orders

## 2016-05-28 ENCOUNTER — Encounter: Payer: Self-pay | Admitting: Internal Medicine

## 2017-01-19 ENCOUNTER — Other Ambulatory Visit (HOSPITAL_COMMUNITY): Payer: Self-pay | Admitting: Internal Medicine

## 2017-01-19 ENCOUNTER — Ambulatory Visit (HOSPITAL_COMMUNITY)
Admission: RE | Admit: 2017-01-19 | Discharge: 2017-01-19 | Disposition: A | Discharge status: Home / Self Care | Payer: Managed Care, Other (non HMO) | Source: Ambulatory Visit | Attending: Internal Medicine | Admitting: Internal Medicine

## 2017-01-19 DIAGNOSIS — M79604 Pain in right leg: Secondary | ICD-10-CM | POA: Insufficient documentation

## 2017-01-19 DIAGNOSIS — M769 Unspecified enthesopathy, lower limb, excluding foot: Secondary | ICD-10-CM | POA: Diagnosis not present

## 2017-03-12 NOTE — Patient Instructions (Addendum)
Justin [name]  03/12/2017   Your procedure is scheduled on:  03-24-17   Report to [facility] Main  Entrance Report to Admitting at 7:30 AM   Call this number if you have problems the morning of surgery [phone]   Remember: Do not eat food or drink liquids :After Midnight.     Take these medicines the morning of surgery with A SIP OF WATER: You may bring and use your eyedrops and inhaler as needed                               You may not have any metal on your body including hair pins and              piercings  Do not wear jewelry, lotions, powders or perfumes, deodorant             Men may shave face and neck.   Do not bring valuables to the hospital. [place].  Contacts, dentures or bridgework may not be worn into surgery.  Leave suitcase in the car. After surgery it may be brought to your room.                Please read over the following fact sheets you were given: _____________________________________________________________________          [facility] - Preparing for Surgery Before surgery, you can play an important role.  Because skin is not sterile, your skin needs to be as free of germs as possible.  You can reduce the number of germs on your skin by washing with CHG (chlorahexidine gluconate) soap before surgery.  CHG is an antiseptic cleaner which kills germs and bonds with the skin to continue killing germs even after washing. Please DO NOT use if you have an allergy to CHG or antibacterial soaps.  If your skin becomes reddened/irritated stop using the CHG and inform your nurse when you arrive at Short Stay. Do not shave (including legs and underarms) for at least 48 hours prior to the first CHG shower.  You may shave your face/neck. Please follow these instructions carefully:  1.  Shower with CHG Soap the night before surgery and the  morning of Surgery.  2.  If you choose to wash your hair,  wash your hair first as usual with your  normal  shampoo.  3.  After you shampoo, rinse your hair and body thoroughly to remove the  shampoo.                           4.  Use CHG as you would any other liquid soap.  You can apply chg directly  to the skin and wash                       Gently with a scrungie or clean washcloth.  5.  Apply the CHG Soap to your body ONLY FROM THE NECK DOWN.   Do not use on face/ open                           Wound or open sores. Avoid contact with eyes, ears  mouth and genitals (private parts).                       Wash face,  Genitals (private parts) with your normal soap.             6.  Wash thoroughly, paying special attention to the area where your surgery  will be performed.  7.  Thoroughly rinse your body with warm water from the neck down.  8.  DO NOT shower/wash with your normal soap after using and rinsing off  the CHG Soap.                9.  Pat yourself dry with a clean towel.            10.  Wear clean pajamas.            11.  Place clean sheets on your bed the night of your first shower and do not  sleep with pets. Day of Surgery : Do not apply any lotions/deodorants the morning of surgery.  Please wear clean clothes to the hospital/surgery center.  FAILURE TO FOLLOW THESE INSTRUCTIONS MAY RESULT IN THE CANCELLATION OF YOUR SURGERY PATIENT SIGNATURE_________________________________  NURSE SIGNATURE__________________________________  ________________________________________________________________________   Justin [name]  An incentive spirometer is a tool that can help keep your lungs clear and active. This tool measures how well you are filling your lungs with each breath. Taking long deep breaths may help reverse or decrease the chance of developing breathing (pulmonary) problems (especially infection) following:  A long period of time when you are unable to move or be active. BEFORE THE PROCEDURE   If the spirometer includes an  indicator to show your best effort, your nurse or respiratory therapist will set it to a desired goal.  If possible, sit up straight or lean slightly forward. Try not to slouch.  Hold the incentive spirometer in an upright position. INSTRUCTIONS FOR USE  1. Sit on the edge of your bed if possible, or sit up as far as you can in bed or on a chair. 2. Hold the incentive spirometer in an upright position. 3. Breathe out normally. 4. Place the mouthpiece in your mouth and seal your lips tightly around it. 5. Breathe in slowly and as deeply as possible, raising the piston or the ball toward the top of the column. 6. Hold your breath for 3-5 seconds or for as long as possible. Allow the piston or ball to fall to the bottom of the column. 7. Remove the mouthpiece from your mouth and breathe out normally. 8. Rest for a few seconds and repeat Steps 1 through 7 at least 10 times every 1-2 hours when you are awake. Take your time and take a few normal breaths between deep breaths. 9. The spirometer may include an indicator to show your best effort. Use the indicator as a goal to work toward during each repetition. 10. After each set of 10 deep breaths, practice coughing to be sure your lungs are clear. If you have an incision (the cut made at the time of surgery), support your incision when coughing by placing a pillow or rolled up towels firmly against it. Once you are able to get out of bed, walk around indoors and cough well. You may stop using the incentive spirometer when instructed by your caregiver.  RISKS AND COMPLICATIONS  Take your time so you do not get dizzy or light-headed.  If you are in pain,  you may need to take or ask for pain medication before doing incentive spirometry. It is harder to take a deep breath if you are having pain. AFTER USE  Rest and breathe slowly and easily.  It can be helpful to keep track of a log of your progress. Your caregiver can provide you with a simple table  to help with this. If you are using the spirometer at home, follow these instructions: [name]:   You are having difficultly using the spirometer.  You have trouble using the spirometer as often as instructed.  Your pain medication is not giving enough relief while using the spirometer.  You develop fever of 100.5 F (38.1 C) or higher. SEEK IMMEDIATE MEDICAL CARE IF:   You cough up bloody sputum that had not been present before.  You develop fever of 102 F (38.9 C) or greater.  You develop worsening pain at or near the incision site. MAKE SURE YOU:   Understand these instructions.  Will watch your condition.  Will get help right away if you are not doing well or get worse. Document Released: 06/22/2006 Document Revised: 05/04/2011 Document Reviewed: 08/23/2006 [facility] Patient Information 2014 [place], [place].   ________________________________________________________________________

## 2017-03-15 ENCOUNTER — Ambulatory Visit (HOSPITAL_COMMUNITY)
Admission: RE | Admit: 2017-03-15 | Discharge: 2017-03-15 | Disposition: A | Discharge status: Home / Self Care | Payer: 59 | Source: Ambulatory Visit | Attending: Surgical | Admitting: Surgical

## 2017-03-15 ENCOUNTER — Encounter (HOSPITAL_COMMUNITY)
Admission: RE | Admit: 2017-03-15 | Discharge: 2017-03-15 | Disposition: A | Discharge status: Home / Self Care | Payer: 59 | Source: Ambulatory Visit | Attending: Orthopedic Surgery | Admitting: Orthopedic Surgery

## 2017-03-15 ENCOUNTER — Other Ambulatory Visit: Payer: Self-pay

## 2017-03-15 ENCOUNTER — Encounter (HOSPITAL_COMMUNITY): Payer: Self-pay

## 2017-03-15 DIAGNOSIS — N200 Calculus of kidney: Secondary | ICD-10-CM | POA: Diagnosis not present

## 2017-03-15 DIAGNOSIS — M545 Low back pain, unspecified: Secondary | ICD-10-CM

## 2017-03-15 DIAGNOSIS — M5136 Other intervertebral disc degeneration, lumbar region: Secondary | ICD-10-CM | POA: Insufficient documentation

## 2017-03-15 DIAGNOSIS — M48061 Spinal stenosis, lumbar region without neurogenic claudication: Secondary | ICD-10-CM | POA: Insufficient documentation

## 2017-03-15 DIAGNOSIS — M5126 Other intervertebral disc displacement, lumbar region: Secondary | ICD-10-CM | POA: Insufficient documentation

## 2017-03-15 DIAGNOSIS — Z01810 Encounter for preprocedural cardiovascular examination: Secondary | ICD-10-CM | POA: Insufficient documentation

## 2017-03-15 DIAGNOSIS — Z01818 Encounter for other preprocedural examination: Secondary | ICD-10-CM | POA: Insufficient documentation

## 2017-03-15 DIAGNOSIS — R001 Bradycardia, unspecified: Secondary | ICD-10-CM | POA: Diagnosis not present

## 2017-03-15 HISTORY — DX: Sleep apnea, unspecified: G47.30

## 2017-03-15 HISTORY — DX: Gastro-esophageal reflux disease without esophagitis: K21.9

## 2017-03-15 HISTORY — DX: Personal history of urinary calculi: Z87.442

## 2017-03-15 LAB — CBC WITH DIFFERENTIAL/PLATELET
Basophils Absolute: 0 10*3/uL (ref 0.0–0.1)
Basophils Relative: 0 %
Eosinophils Absolute: 0.1 10*3/uL (ref 0.0–0.7)
Eosinophils Relative: 1 %
HCT: 40.2 % (ref 39.0–52.0)
Hemoglobin: 13.8 g/dL (ref 13.0–17.0)
Lymphocytes Relative: 31 %
Lymphs Abs: 1.4 10*3/uL (ref 0.7–4.0)
MCH: 29.9 pg (ref 26.0–34.0)
MCHC: 34.3 g/dL (ref 30.0–36.0)
MCV: 87 fL (ref 78.0–100.0)
Monocytes Absolute: 0.4 10*3/uL (ref 0.1–1.0)
Monocytes Relative: 8 %
Neutro Abs: 2.8 10*3/uL (ref 1.7–7.7)
Neutrophils Relative %: 60 %
Platelets: 148 10*3/uL — ABNORMAL LOW (ref 150–400)
RBC: 4.62 MIL/uL (ref 4.22–5.81)
RDW: 13.5 % (ref 11.5–15.5)
WBC: 4.6 10*3/uL (ref 4.0–10.5)

## 2017-03-15 LAB — PROTIME-INR
INR: 1.15
Prothrombin Time: 14.7 seconds (ref 11.4–15.2)

## 2017-03-15 LAB — COMPREHENSIVE METABOLIC PANEL
ALT: 19 U/L (ref 17–63)
AST: 22 U/L (ref 15–41)
Albumin: 4 g/dL (ref 3.5–5.0)
Alkaline Phosphatase: 50 U/L (ref 38–126)
Anion gap: 6 (ref 5–15)
BUN: 20 mg/dL (ref 6–20)
CO2: 24 mmol/L (ref 22–32)
Calcium: 9 mg/dL (ref 8.9–10.3)
Chloride: 110 mmol/L (ref 101–111)
Creatinine, Ser: 0.91 mg/dL (ref 0.61–1.24)
GFR calc Af Amer: >60 mL/min (ref >60)
GFR calc non Af Amer: >60 mL/min (ref >60)
Glucose, Bld: 90 mg/dL (ref 65–99)
Potassium: 3.7 mmol/L (ref 3.5–5.1)
Sodium: 140 mmol/L (ref 135–145)
Total Bilirubin: 0.7 mg/dL (ref 0.3–1.2)
Total Protein: 6.5 g/dL (ref 6.5–8.1)

## 2017-03-15 LAB — URINALYSIS, ROUTINE W REFLEX MICROSCOPIC
Bilirubin Urine: NEGATIVE
Glucose, UA: NEGATIVE mg/dL
Hgb urine dipstick: NEGATIVE
Ketones, ur: NEGATIVE mg/dL
Leukocytes, UA: NEGATIVE
Nitrite: NEGATIVE
Protein, ur: NEGATIVE mg/dL
Specific Gravity, Urine: 1.005 (ref 1.005–1.030)
pH: 6 (ref 5.0–8.0)

## 2017-03-15 LAB — SURGICAL PCR SCREEN
MRSA, PCR: NEGATIVE
STAPHYLOCOCCUS AUREUS: NEGATIVE

## 2017-03-15 LAB — APTT: aPTT: 28 seconds (ref 24–36)

## 2017-03-17 ENCOUNTER — Encounter: Payer: Self-pay | Admitting: Internal Medicine

## 2017-03-19 NOTE — H&P (Signed)
Justin [name] is an 67 y.o. male.   Chief Complaint: low back pain  HPI: The patient presented with the chief complaint of low back pain. He has had this trouble for about 3 months. He has had progressive weakness and pain into the right leg and foot, as well as numbness and tingling. He denies an injury. He has seen his PCP and has had cortisone injection, oral corticosteroids, and NSAIDs with no relief. MRI of the lumbar spine showed severe spinal stenosis at L4-L5 on the right in addition to disc herniation at the same level. After discussion with the patient and his family, Dr. Gladstone Lighter and the patient elected to proceed with central decompression and microdiscectomy.   Past Medical History:  Diagnosis Date  . Acid reflux   . GERD (gastroesophageal reflux disease)   . History of kidney stones   . Hypertension   . IBS (irritable bowel syndrome)   . Kidney stones   . Sleep apnea   . Small bowel obstruction [facility])     Past Surgical History:  Procedure Laterality Date  . ANTERIOR CRUCIATE LIGAMENT REPAIR    . APPENDECTOMY    . CHOLECYSTECTOMY  2003  . CHOLECYSTECTOMY    . COLONOSCOPY  June 2004   Dr. [name]: normal  . COLONOSCOPY N/A 08/31/2012   Procedure: COLONOSCOPY;  Surgeon: [name], MD;  Location: [facility] SUITE;  Service: Endoscopy;  Laterality: N/A;  8:30  . EXPLORATORY LAPAROTOMY     remote past  . KNEE SURGERY  1980's.   bilateral    Family History  Problem Relation Age of Onset  . Cancer Sister        Breast  . Colon cancer Neg Hx    Social History:  reports that  has never smoked. He quit smokeless tobacco use about 6 years ago. His smokeless tobacco use included chew. He reports that he does not drink alcohol or use drugs.  Allergies: No Known Allergies   Current Outpatient Medications:  .  albuterol (PROVENTIL HFA;VENTOLIN HFA) 108 (90 Base) MCG/ACT inhaler, Inhale 2 puffs into the lungs every 6 (six) hours as needed for wheezing or shortness of breath.,  Disp: , Rfl:  .  ALPRAZolam (XANAX) 1 MG tablet, Take 1 mg by mouth at bedtime as needed for sleep., Disp: , Rfl: 0 .  DEXILANT 60 MG capsule, Take 60 mg by mouth at bedtime. , Disp: , Rfl: 0 .  ibuprofen (ADVIL,MOTRIN) 200 MG tablet, Take 400-800 mg by mouth every 6 (six) hours as needed for headache or moderate pain., Disp: , Rfl:  .  losartan (COZAAR) 50 MG tablet, Take 50 mg by mouth daily., Disp: , Rfl: 0 .  Probiotic Product (ALIGN PO), Take 1 capsule by mouth daily., Disp: , Rfl:  .  RESTASIS MULTIDOSE 0.05 % ophthalmic emulsion, Place 1 drop into both eyes daily., Disp: , Rfl: 0 .  albuterol (PROVENTIL HFA;VENTOLIN HFA) 108 (90 Base) MCG/ACT inhaler, Inhale 2 puffs into the lungs every 6 (six) hours as needed for wheezing or shortness of breath., Disp: , Rfl:  .  ALPRAZolam (XANAX) 1 MG tablet, Take 1 mg by mouth at bedtime as needed for sleep. , Disp: , Rfl:  .  colchicine 0.6 MG tablet, Take 1 tablet by mouth daily as needed (gout flare). , Disp: , Rfl:  .  dexlansoprazole (DEXILANT) 60 MG capsule, Take 1 capsule (60 mg total) by mouth daily., Disp: 90 capsule, Rfl: 3 .  esomeprazole ([place])  40 MG capsule, Take 40 mg by mouth at bedtime. , Disp: , Rfl:  .  ibuprofen (ADVIL,MOTRIN) 800 MG tablet, Take 800 mg by mouth every 8 (eight) hours as needed for mild pain., Disp: , Rfl:  .  ketorolac (TORADOL) 10 MG tablet, Take 10 mg by mouth every 6 (six) hours as needed., Disp: , Rfl:  .  losartan (COZAAR) 100 MG tablet, Take 1 tablet (100 mg total) by mouth daily. Restart in 2 days. (Patient taking differently: Take 50 mg by mouth daily. Restart in 2 days.), Disp: , Rfl:  .  nebivolol (BYSTOLIC) 10 MG tablet, Take 1 tablet (10 mg total) by mouth daily. Restart in 2 days. (Patient taking differently: Take 5 mg by mouth daily. Restart in 2 days.), Disp: , Rfl:  .  tamsulosin (FLOMAX) 0.4 MG CAPS capsule, Take 0.4 mg by mouth as needed., Disp: , Rfl:    Review of Systems  Constitutional:  Negative for chills, diaphoresis, fever, malaise/fatigue and weight loss.  HENT: Negative.   Eyes: Negative.   Respiratory: Negative.   Cardiovascular: Negative.   Gastrointestinal: Negative.   Genitourinary: Negative.   Musculoskeletal: Positive for back pain, joint pain and myalgias. Negative for falls and neck pain.  Skin: Negative.   Neurological: Positive for tingling, sensory change, focal weakness and weakness. Negative for dizziness, tremors, speech change, seizures, loss of consciousness and headaches.  Endo/Heme/Allergies: Negative.   Psychiatric/Behavioral: Negative.    Vitals Temp (F)   98.2      Pulse Rate   68      Resp   18      BP   167/96      SpO2 (%)   99      Weight (kg)   95.7         Physical Exam  Constitutional: He is oriented to person, place, and time. He appears well-developed. No distress.  HENT:  Head: Normocephalic and atraumatic.  Right Ear: External ear normal.  Left Ear: External ear normal.  Nose: Nose normal.  Mouth/Throat: Oropharynx is clear and moist.  Eyes: Conjunctivae and EOM are normal.  Neck: Normal range of motion. Neck supple.  Cardiovascular: Normal rate, regular rhythm, normal heart sounds and intact distal pulses.  No murmur heard. Respiratory: Effort normal and breath sounds normal. No respiratory distress. He has no wheezes.  GI: Soft. He exhibits no distension. There is no tenderness.  Musculoskeletal:       Right hip: Normal.       Left hip: Normal.       Right knee: Normal.       Left knee: Normal.       Lumbar back: He exhibits decreased range of motion, tenderness, pain and spasm. He exhibits no bony tenderness.  Pain with motion of the lumbar spine with radiation into the right LE   Neurological: He is alert and oriented to person, place, and time. A sensory deficit is present.  2/5 weakness in the dorsiflexors of the right foot; otherwise strength in LE 5/5  Skin: No rash noted. He is not diaphoretic. No  erythema.  Psychiatric: He has a normal mood and affect. His behavior is normal.     Assessment/Plan Lumbar spinal stenosis and disc herniation L4-L5 right [name] needs a lumbar central decompressive laminectomy and microdiscectomy L4-L5 on the right. Dr. Gladstone Lighter discussed the risks and benefits with the patient. He will stay overnight for observation.   Selia Wareing LAUREN, PA-C 03/19/2017, 9:05 AM

## 2017-03-23 MED ORDER — BUPIVACAINE LIPOSOME 1.3 % IJ SUSP
20.0000 mL | Freq: Once | INTRAMUSCULAR | Status: DC
  Filled 2017-03-23: qty 20

## 2017-03-24 ENCOUNTER — Observation Stay (HOSPITAL_COMMUNITY)
Admission: RE | Admit: 2017-03-24 | Discharge: 2017-03-25 | Disposition: A | Discharge status: Home / Self Care | Payer: 59 | Source: Ambulatory Visit | Attending: Orthopedic Surgery | Admitting: Orthopedic Surgery

## 2017-03-24 ENCOUNTER — Ambulatory Visit (HOSPITAL_COMMUNITY): Payer: 59

## 2017-03-24 ENCOUNTER — Ambulatory Visit (HOSPITAL_COMMUNITY): Payer: 59 | Admitting: Anesthesiology

## 2017-03-24 ENCOUNTER — Other Ambulatory Visit: Payer: Self-pay

## 2017-03-24 ENCOUNTER — Encounter (HOSPITAL_COMMUNITY): Payer: Self-pay | Admitting: Emergency Medicine

## 2017-03-24 ENCOUNTER — Encounter (HOSPITAL_COMMUNITY)
Admission: RE | Disposition: A | Discharge status: Home / Self Care | Payer: Self-pay | Source: Ambulatory Visit | Attending: Orthopedic Surgery

## 2017-03-24 DIAGNOSIS — K589 Irritable bowel syndrome without diarrhea: Secondary | ICD-10-CM | POA: Diagnosis not present

## 2017-03-24 DIAGNOSIS — Z79899 Other long term (current) drug therapy: Secondary | ICD-10-CM | POA: Diagnosis not present

## 2017-03-24 DIAGNOSIS — K219 Gastro-esophageal reflux disease without esophagitis: Secondary | ICD-10-CM | POA: Insufficient documentation

## 2017-03-24 DIAGNOSIS — R200 Anesthesia of skin: Secondary | ICD-10-CM | POA: Diagnosis not present

## 2017-03-24 DIAGNOSIS — Z419 Encounter for procedure for purposes other than remedying health state, unspecified: Secondary | ICD-10-CM

## 2017-03-24 DIAGNOSIS — Z9049 Acquired absence of other specified parts of digestive tract: Secondary | ICD-10-CM | POA: Diagnosis not present

## 2017-03-24 DIAGNOSIS — M5116 Intervertebral disc disorders with radiculopathy, lumbar region: Secondary | ICD-10-CM | POA: Diagnosis not present

## 2017-03-24 DIAGNOSIS — Z87442 Personal history of urinary calculi: Secondary | ICD-10-CM | POA: Insufficient documentation

## 2017-03-24 DIAGNOSIS — M21372 Foot drop, left foot: Secondary | ICD-10-CM | POA: Diagnosis not present

## 2017-03-24 DIAGNOSIS — Z7951 Long term (current) use of inhaled steroids: Secondary | ICD-10-CM | POA: Diagnosis not present

## 2017-03-24 DIAGNOSIS — N200 Calculus of kidney: Secondary | ICD-10-CM | POA: Insufficient documentation

## 2017-03-24 DIAGNOSIS — Z803 Family history of malignant neoplasm of breast: Secondary | ICD-10-CM | POA: Insufficient documentation

## 2017-03-24 DIAGNOSIS — I10 Essential (primary) hypertension: Secondary | ICD-10-CM | POA: Insufficient documentation

## 2017-03-24 DIAGNOSIS — N289 Disorder of kidney and ureter, unspecified: Secondary | ICD-10-CM | POA: Insufficient documentation

## 2017-03-24 DIAGNOSIS — M48062 Spinal stenosis, lumbar region with neurogenic claudication: Secondary | ICD-10-CM | POA: Diagnosis not present

## 2017-03-24 DIAGNOSIS — R531 Weakness: Secondary | ICD-10-CM | POA: Diagnosis not present

## 2017-03-24 DIAGNOSIS — G4730 Sleep apnea, unspecified: Secondary | ICD-10-CM | POA: Insufficient documentation

## 2017-03-24 DIAGNOSIS — Z9889 Other specified postprocedural states: Secondary | ICD-10-CM

## 2017-03-24 HISTORY — PX: LUMBAR LAMINECTOMY/DECOMPRESSION MICRODISCECTOMY: SHX5026

## 2017-03-24 SURGERY — LUMBAR LAMINECTOMY/DECOMPRESSION MICRODISCECTOMY
Anesthesia: General | Site: Back | Laterality: Bilateral

## 2017-03-24 MED ORDER — LACTATED RINGERS IV SOLN
INTRAVENOUS | Status: DC
  Administered 2017-03-24 (×2): via INTRAVENOUS

## 2017-03-24 MED ORDER — HYDROMORPHONE HCL 2 MG PO TABS: 2.0000 mg | ORAL_TABLET | ORAL | Status: DC | PRN

## 2017-03-24 MED ORDER — CEFAZOLIN SODIUM-DEXTROSE 2-4 GM/100ML-% IV SOLN
2.0000 g | INTRAVENOUS | Status: AC
  Administered 2017-03-24: 2 g via INTRAVENOUS
  Filled 2017-03-24: qty 100

## 2017-03-24 MED ORDER — PANTOPRAZOLE SODIUM 40 MG PO TBEC
40.0000 mg | DELAYED_RELEASE_TABLET | Freq: Every day | ORAL | Status: DC
Start: 2017-03-24 — End: 2017-03-25
  Administered 2017-03-24 – 2017-03-25 (×2): 40 mg via ORAL
  Filled 2017-03-24 (×2): qty 1

## 2017-03-24 MED ORDER — HYDROMORPHONE HCL 1 MG/ML IJ SOLN
0.2500 mg | INTRAMUSCULAR | Status: DC | PRN
  Administered 2017-03-24 (×2): 0.5 mg via INTRAVENOUS

## 2017-03-24 MED ORDER — EPHEDRINE SULFATE-NACL 50-0.9 MG/10ML-% IV SOSY
PREFILLED_SYRINGE | INTRAVENOUS | Status: DC | PRN
  Administered 2017-03-24: 5 mg via INTRAVENOUS
  Administered 2017-03-24: 10 mg via INTRAVENOUS
  Administered 2017-03-24 (×3): 5 mg via INTRAVENOUS

## 2017-03-24 MED ORDER — NEBIVOLOL HCL 5 MG PO TABS: 5.0000 mg | ORAL_TABLET | Freq: Every day | ORAL | Status: DC

## 2017-03-24 MED ORDER — MIDAZOLAM HCL 5 MG/5ML IJ SOLN
INTRAMUSCULAR | Status: DC | PRN
  Administered 2017-03-24: 2 mg via INTRAVENOUS

## 2017-03-24 MED ORDER — LOSARTAN POTASSIUM 50 MG PO TABS: 50.0000 mg | ORAL_TABLET | Freq: Every day | ORAL | Status: DC

## 2017-03-24 MED ORDER — FENTANYL CITRATE (PF) 100 MCG/2ML IJ SOLN
INTRAMUSCULAR | Status: DC | PRN
  Administered 2017-03-24 (×2): 50 ug via INTRAVENOUS
  Administered 2017-03-24: 100 ug via INTRAVENOUS

## 2017-03-24 MED ORDER — TAMSULOSIN HCL 0.4 MG PO CAPS
0.4000 mg | ORAL_CAPSULE | Freq: Every day | ORAL | Status: DC
  Filled 2017-03-24: qty 1

## 2017-03-24 MED ORDER — BUPIVACAINE LIPOSOME 1.3 % IJ SUSP
INTRAMUSCULAR | Status: DC | PRN
  Administered 2017-03-24: 20 mL

## 2017-03-24 MED ORDER — ALBUTEROL SULFATE (2.5 MG/3ML) 0.083% IN NEBU: 2.5000 mg | INHALATION_SOLUTION | Freq: Four times a day (QID) | RESPIRATORY_TRACT | Status: DC | PRN

## 2017-03-24 MED ORDER — ONDANSETRON HCL 4 MG/2ML IJ SOLN
INTRAMUSCULAR | Status: DC | PRN
  Administered 2017-03-24: 4 mg via INTRAVENOUS

## 2017-03-24 MED ORDER — LIDOCAINE 2% (20 MG/ML) 5 ML SYRINGE
INTRAMUSCULAR | Status: DC | PRN
  Administered 2017-03-24: 80 mg via INTRAVENOUS

## 2017-03-24 MED ORDER — HYDROMORPHONE HCL 1 MG/ML IJ SOLN
INTRAMUSCULAR | Status: AC
  Filled 2017-03-24: qty 1

## 2017-03-24 MED ORDER — HEMOSTATIC AGENTS (NO CHARGE) OPTIME
TOPICAL | Status: DC | PRN
  Administered 2017-03-24: 1 via TOPICAL

## 2017-03-24 MED ORDER — POLYMYXIN B SULFATE 500000 UNITS IJ SOLR
INTRAMUSCULAR | Status: DC | PRN
  Administered 2017-03-24: 500 mL

## 2017-03-24 MED ORDER — ALPRAZOLAM 1 MG PO TABS: 1.0000 mg | ORAL_TABLET | Freq: Every evening | ORAL | Status: DC | PRN

## 2017-03-24 MED ORDER — COLCHICINE 0.6 MG PO TABS: 0.6000 mg | ORAL_TABLET | Freq: Every day | ORAL | Status: DC | PRN

## 2017-03-24 MED ORDER — ALBUTEROL SULFATE HFA 108 (90 BASE) MCG/ACT IN AERS: 2.0000 | INHALATION_SPRAY | Freq: Four times a day (QID) | RESPIRATORY_TRACT | Status: DC | PRN

## 2017-03-24 MED ORDER — PROPOFOL 10 MG/ML IV BOLUS
INTRAVENOUS | Status: DC | PRN
  Administered 2017-03-24: 170 mg via INTRAVENOUS

## 2017-03-24 MED ORDER — HYDROMORPHONE HCL 1 MG/ML IJ SOLN
0.5000 mg | INTRAMUSCULAR | Status: DC | PRN
Start: 2017-03-24 — End: 2017-03-25

## 2017-03-24 MED ORDER — MEPERIDINE HCL 50 MG/ML IJ SOLN: 6.2500 mg | INTRAMUSCULAR | Status: DC | PRN

## 2017-03-24 MED ORDER — PANTOPRAZOLE SODIUM 40 MG PO TBEC: 40.0000 mg | DELAYED_RELEASE_TABLET | Freq: Every day | ORAL | Status: DC

## 2017-03-24 MED ORDER — SUGAMMADEX SODIUM 200 MG/2ML IV SOLN
INTRAVENOUS | Status: DC | PRN
  Administered 2017-03-24: 200 mg via INTRAVENOUS

## 2017-03-24 MED ORDER — DEXAMETHASONE SODIUM PHOSPHATE 10 MG/ML IJ SOLN
INTRAMUSCULAR | Status: DC | PRN
  Administered 2017-03-24: 10 mg via INTRAVENOUS

## 2017-03-24 MED ORDER — ROCURONIUM BROMIDE 10 MG/ML (PF) SYRINGE
PREFILLED_SYRINGE | INTRAVENOUS | Status: DC | PRN
  Administered 2017-03-24 (×3): 10 mg via INTRAVENOUS
  Administered 2017-03-24: 50 mg via INTRAVENOUS

## 2017-03-24 MED ORDER — CHLORHEXIDINE GLUCONATE 4 % EX LIQD: 60.0000 mL | Freq: Once | CUTANEOUS | Status: DC

## 2017-03-24 MED ORDER — BACITRACIN-NEOMYCIN-POLYMYXIN 400-5-5000 EX OINT
TOPICAL_OINTMENT | CUTANEOUS | Status: DC | PRN
  Administered 2017-03-24: 1 via TOPICAL

## 2017-03-24 MED ORDER — PROMETHAZINE HCL 25 MG/ML IJ SOLN: 6.2500 mg | INTRAMUSCULAR | Status: DC | PRN

## 2017-03-24 MED ORDER — BUPIVACAINE-EPINEPHRINE 0.5% -1:200000 IJ SOLN
INTRAMUSCULAR | Status: DC | PRN
Start: 2017-03-24 — End: 2017-03-24
  Administered 2017-03-24: 15 mL

## 2017-03-24 SURGICAL SUPPLY — 49 items
BAG ZIPLOCK 12X15 (MISCELLANEOUS) ×3 IMPLANT
CLEANER TIP ELECTROSURG 2X2 (MISCELLANEOUS) ×3 IMPLANT
COVER SURGICAL LIGHT HANDLE (MISCELLANEOUS) ×3 IMPLANT
DRAIN PENROSE 18X1/4 LTX STRL (WOUND CARE) IMPLANT
DRAPE MICROSCOPE LEICA (MISCELLANEOUS) ×3 IMPLANT
DRAPE POUCH INSTRU U-SHP 10X18 (DRAPES) ×3 IMPLANT
DRAPE SHEET LG 3/4 BI-LAMINATE (DRAPES) ×3 IMPLANT
DRAPE SURG 17X11 SM STRL (DRAPES) ×3 IMPLANT
DRSG ADAPTIC 3X8 NADH LF (GAUZE/BANDAGES/DRESSINGS) ×3 IMPLANT
DURAPREP 26ML APPLICATOR (WOUND CARE) ×3 IMPLANT
ELECT BLADE TIP CTD 4 INCH (ELECTRODE) ×3 IMPLANT
ELECT REM PT RETURN 15FT ADLT (MISCELLANEOUS) ×3 IMPLANT
GAUZE SPONGE 4X4 12PLY STRL (GAUZE/BANDAGES/DRESSINGS) ×3 IMPLANT
GLOVE BIOGEL PI IND STRL 6.5 (GLOVE) ×1 IMPLANT
GLOVE BIOGEL PI IND STRL 7.0 (GLOVE) ×2 IMPLANT
GLOVE BIOGEL PI IND STRL 7.5 (GLOVE) ×1 IMPLANT
GLOVE BIOGEL PI IND STRL 8.5 (GLOVE) ×1 IMPLANT
GLOVE BIOGEL PI INDICATOR 6.5 (GLOVE) ×2
GLOVE BIOGEL PI INDICATOR 7.0 (GLOVE) ×4
GLOVE BIOGEL PI INDICATOR 7.5 (GLOVE) ×2
GLOVE BIOGEL PI INDICATOR 8.5 (GLOVE) ×2
GLOVE ECLIPSE 8.0 STRL XLNG CF (GLOVE) ×6 IMPLANT
GLOVE SURG SS PI 6.5 STRL IVOR (GLOVE) ×3 IMPLANT
GOWN STRL REUS W/ TWL LRG LVL3 (GOWN DISPOSABLE) ×2 IMPLANT
GOWN STRL REUS W/TWL LRG LVL3 (GOWN DISPOSABLE) ×4
GOWN STRL REUS W/TWL XL LVL3 (GOWN DISPOSABLE) ×6 IMPLANT
HEMOSTAT SPONGE AVITENE ULTRA (HEMOSTASIS) ×3 IMPLANT
KIT BASIN OR (CUSTOM PROCEDURE TRAY) ×3 IMPLANT
KIT POSITIONING SURG ANDREWS (MISCELLANEOUS) ×3 IMPLANT
MANIFOLD NEPTUNE II (INSTRUMENTS) ×3 IMPLANT
MARKER SKIN DUAL TIP RULER LAB (MISCELLANEOUS) ×3 IMPLANT
NEEDLE HYPO 22GX1.5 SAFETY (NEEDLE) ×6 IMPLANT
NEEDLE SPNL 18GX3.5 QUINCKE PK (NEEDLE) ×6 IMPLANT
PACK LAMINECTOMY ORTHO (CUSTOM PROCEDURE TRAY) ×3 IMPLANT
PAD ABD 8X10 STRL (GAUZE/BANDAGES/DRESSINGS) ×9 IMPLANT
PATTIES SURGICAL .5 X.5 (GAUZE/BANDAGES/DRESSINGS) IMPLANT
PATTIES SURGICAL .75X.75 (GAUZE/BANDAGES/DRESSINGS) ×3 IMPLANT
PIN SAFETY NICK PLATE  2 MED (MISCELLANEOUS)
PIN SAFETY NICK PLATE 2 MED (MISCELLANEOUS) IMPLANT
SPONGE LAP 4X18 X RAY DECT (DISPOSABLE) ×9 IMPLANT
STAPLER VISISTAT 35W (STAPLE) ×3 IMPLANT
SUT BONE WAX W31G (SUTURE) ×3 IMPLANT
SUT VIC AB 1 CT1 27 (SUTURE) ×4
SUT VIC AB 1 CT1 27XBRD ANTBC (SUTURE) ×2 IMPLANT
SUT VIC AB 2-0 CT1 27 (SUTURE) ×4
SUT VIC AB 2-0 CT1 TAPERPNT 27 (SUTURE) ×2 IMPLANT
SYR 20CC LL (SYRINGE) ×6 IMPLANT
TAPE CLOTH SURG 6X10 WHT LF (GAUZE/BANDAGES/DRESSINGS) ×3 IMPLANT
TOWEL OR 17X26 10 PK STRL BLUE (TOWEL DISPOSABLE) ×3 IMPLANT

## 2017-03-24 NOTE — Anesthesia Preprocedure Evaluation (Signed)
Anesthesia Evaluation  Patient identified by MRN, date of birth, ID band Patient awake    Reviewed: Allergy & Precautions, NPO status , Patient's Chart, lab work & pertinent test results  Airway Mallampati: II  TM Distance: >3 FB Neck ROM: Full    Dental no notable dental hx.    Pulmonary sleep apnea ,    Pulmonary exam normal breath sounds clear to auscultation       Cardiovascular hypertension, Pt. on medications Normal cardiovascular exam Rhythm:Regular Rate:Normal     Neuro/Psych negative neurological ROS  negative psych ROS   GI/Hepatic Neg liver ROS, GERD  ,  Endo/Other  negative endocrine ROS  Renal/GU Renal disease     Musculoskeletal negative musculoskeletal ROS (+)   Abdominal   Peds  Hematology negative hematology ROS (+)   Anesthesia Other Findings   Reproductive/Obstetrics negative OB ROS                             Anesthesia Physical Anesthesia Plan  ASA: II  Anesthesia Plan: General   Post-op Pain Management:    Induction: Intravenous  PONV Risk Score and Plan: 3 and Ondansetron and Dexamethasone  Airway Management Planned: Oral ETT  Additional Equipment:   Intra-op Plan:   Post-operative Plan: Extubation in OR  Informed Consent: I have reviewed the patients History and Physical, chart, labs and discussed the procedure including the risks, benefits and alternatives for the proposed anesthesia with the patient or authorized representative who has indicated his/her understanding and acceptance.   Dental advisory given  Plan Discussed with: CRNA  Anesthesia Plan Comments:         Anesthesia Quick Evaluation

## 2017-03-24 NOTE — Interval H&P Note (Signed)
History and Physical Interval Note:  03/24/2017 9:50 AM  [name]  has presented today for surgery, with the diagnosis of Spinal stenosis and herniated nucleus pulposus L4-L5 right  The various methods of treatment have been discussed with the patient and family. After consideration of risks, benefits and other options for treatment, the patient has consented to  Procedure(s): Central decompression, lumbar laminectomy and microdisectomy L4-L5 (N/A) as a surgical intervention .  The patient's history has been reviewed, patient examined, no change in status, stable for surgery.  I have reviewed the patient's chart and labs.  Questions were answered to the patient's satisfaction.     Justin [name]

## 2017-03-24 NOTE — Anesthesia Postprocedure Evaluation (Signed)
Anesthesia Post Note  Patient: Justin [name]  Procedure(s) Performed: Complete Decompression Lumbar Laminectomy at L4-L5 for Spinal Stenosis, and Foraminotomy for L4 & L5 root bilaterally for foraminal stenosis. (Bilateral Back)     Patient location during evaluation: PACU Anesthesia Type: General Level of consciousness: sedated and patient cooperative Pain management: pain level controlled Vital Signs Assessment: post-procedure vital signs reviewed and stable Respiratory status: spontaneous breathing Cardiovascular status: stable Anesthetic complications: no    Last Vitals:  Vitals:   03/24/17 1300 03/24/17 1415  BP:    Pulse: 97   Resp: 10   Temp:  36.7 C  SpO2: 95%     Last Pain:  Vitals:   03/24/17 1415  TempSrc:   PainSc: 3                  Nolon Nations

## 2017-03-24 NOTE — Interval H&P Note (Signed)
History and Physical Interval Note:  03/24/2017 9:49 AM  [name]  has presented today for surgery, with the diagnosis of Spinal stenosis and herniated nucleus pulposus L4-L5 right  The various methods of treatment have been discussed with the patient and family. After consideration of risks, benefits and other options for treatment, the patient has consented to  Procedure(s): Central decompression, lumbar laminectomy and microdisectomy L4-L5 (N/A) as a surgical intervention .  The patient's history has been reviewed, patient examined, no change in status, stable for surgery.  I have reviewed the patient's chart and labs.  Questions were answered to the patient's satisfaction.     Justin [name]

## 2017-03-24 NOTE — Transfer of Care (Signed)
Immediate Anesthesia Transfer of Care Note  Patient: Justin [name]  Procedure(s) Performed: Complete Decompression Lumbar Laminectomy at L4-L5 for Spinal Stenosis, and Foraminotomy for L4 & L5 root bilaterally for foraminal stenosis. (Bilateral Back)  Patient Location: PACU  Anesthesia Type:General  Level of Consciousness: awake, alert  and oriented  Airway & Oxygen Therapy: Patient Spontanous Breathing and Patient connected to face mask oxygen  Post-op Assessment: Report given to RN  Post vital signs: Reviewed and stable  Last Vitals:  Vitals:   03/24/17 0802 03/24/17 1220  BP: (!) 167/96 (!) 169/94  Pulse: 68 (!) 115  Resp: 18 13  Temp: 36.8 C 36.9 C  SpO2: 99% 94%    Last Pain:  Vitals:   03/24/17 0802  TempSrc: Oral  PainSc: 1       Patients Stated Pain Goal: 4 (07/03/00 1117)  Complications: No apparent anesthesia complications

## 2017-03-24 NOTE — Op Note (Signed)
NAME:  Justin [name], Justin [name] NO.:  [id number]  MEDICAL RECORD NO.:  [id number]  LOCATION:                                 FACILITY:  PHYSICIAN:  Kipp Brood. Zaria Taha, M.D.DATE OF BIRTH:  01/24/1951  DATE OF PROCEDURE:  03/24/2017 DATE OF DISCHARGE:                              OPERATIVE REPORT   SURGEON:  Kipp Brood. Gladstone Lighter, M.D.  ASSISTNAT:  Ardeen Jourdain, [place]  PREOPERATIVE DIAGNOSES: 1. Severe spinal stenosis at L4-5. 2. Foraminal stenosis involving the L4 and the L5 roots bilaterally. 3. Partial footdrop on the left.  POSTOPERATIVE DIAGNOSES: 1. Severe spinal stenosis at L4-5. 2. Foraminal stenosis involving the L4 and the L5 roots bilaterally. 3. Partial footdrop on the left.  OPERATIONS: 1. Complete decompressive lumbar laminectomy at L4-5 for spinal     stenosis. 2. Foraminotomies for the L4 and the L5 roots bilaterally. 3. Exploration of the L4-5 disk.  DESCRIPTION OF PROCEDURE:  Under general anesthesia, the patient on his spinal frame, a routine orthopedic prep and draping of the lower back was carried out.  The appropriate time-out was carried out.  I also marked the appropriate right side of the back in the holding area.  At this time, the patient had 2 g of IV Ancef.  After the prep and time- out, I then placed two needles in the back for localization purposes, x- ray was taken.  Incision then was made over the L4-5 space.  It was extended proximally and distally in the usual fashion.  At this particular time, I then separated the muscle from the lamina and spinous process bilaterally.  Another x-ray was taken to verify position. Following that, the [facility] retractors were inserted.  I then began the procedure.  Note, prior to the incision, I did inject 20 mL of 0.5% Marcaine and epinephrine in the soft tissue to control bleeding.  Once the [facility] retractors were inserted, I then removed the spinous process of L4 and did a complete central  decompressive lumbar laminectomy at L4-5.  Note, the canal was extremely tight.  The microscope was brought in.  I left the ligamentum flavum initially in place while I did my dissection towards decompression in the lateral recesses and foraminotomies.  Following that, I then gently inserted a pledget and then removed the ligamentum flavum.  The ligamentum flavum was extremely thickened.  The recesses were extremely tight as well.  By way of the use of the microscope, we gently dissected out laterally and medially bilaterally, and went up proximally and distally with a hockey- stick until we made sure that we were completely decompressed both directions.  We then did foraminotomies and I utilized the hockey-stick to go out the foramina, we made sure that the nerve root now free.  I did place a [place] retractor over the disk space.  The lateral recess was a major issue.  There was no obvious disk herniation.  Once we did the decompression of the lateral recess especially on the right, the nerve root now was quite free.  I thoroughly irrigated out the area and then went on and inserted some thrombin-soaked Gelfoam.  Note, a third x- ray was taken to  verify the position.  At this time, the wound then was closed in usual fashion.  I left a small deep distal and proximal part of the wound open for drainage purposes.  Remaining part of the wound was closed in usual fashion, skin with metal staples.  At the end of the procedure prior to closing the skin, we injected 20 mL of Exparel into the soft tissue for pain control.  The patient left the operating room in satisfactory condition.  Note, sterile dressings were applied.          ______________________________ Kipp Brood Gladstone Lighter, M.D.     RAG/MEDQ  D:  03/24/2017  T:  03/24/2017  Job:  [id number]

## 2017-03-24 NOTE — Anesthesia Procedure Notes (Signed)
Procedure Name: Intubation Date/Time: 03/24/2017 10:10 AM Performed by: Lavina Hamman, CRNA Pre-anesthesia Checklist: Patient identified, Emergency Drugs available, Suction available, Patient being monitored and Timeout performed Patient Re-evaluated:Patient Re-evaluated prior to induction Oxygen Delivery Method: Circle system utilized Preoxygenation: Pre-oxygenation with 100% oxygen Induction Type: IV induction Ventilation: Mask ventilation without difficulty Laryngoscope Size: Mac and 4 Grade View: Grade I Tube type: Oral Tube size: 7.5 mm Number of attempts: 1 Airway Equipment and Method: Stylet Placement Confirmation: ETT inserted through vocal cords under direct vision,  positive ETCO2,  CO2 detector and breath sounds checked- equal and bilateral Secured at: 23 cm Tube secured with: Tape Dental Injury: Teeth and Oropharynx as per pre-operative assessment

## 2017-03-24 NOTE — Brief Op Note (Signed)
03/24/2017  11:53 AM  PATIENT:  Justin [name]  67 y.o. male  PRE-OPERATIVE DIAGNOSIS:  Spinal stenosis and herniated nucleus pulposus L4-L5 right;Partial foot drop on the Right.  POST-OPERATIVE DIAGNOSIS:  Severe Spinal Stenosis at L-4-L-5;Foraminal Stenosis involving the L_4 and L-5 Nerve Roots Bilaterally.Partial Foot Drop on the Right.  PROCEDURE:  Complete Decompressive Lumbar Laminectomy at L-4-L-5 for Severe Spinal Stenosis and Bilateral Foraminotomies for the L-4 andL-5 nerve roots SURGEON:  Surgeon(s) and Role:    Latanya Maudlin, MD - Primary  PHYSICIAN ASSISTANT: Ardeen Jourdain PA  ASSISTANTS: Ardeen Jourdain PA   ANESTHESIA:   general  EBL:  50cc  BLOOD ADMINISTERED:none  DRAINS: none   LOCAL MEDICATIONS USED:  MARCAINE 20cc of 0.50% with Epinephrine at the start of the case and 20cc of Exparel at the end of the case.    SPECIMEN:  No Specimen  DISPOSITION OF SPECIMEN:  N/A  COUNTS:  YES  TOURNIQUET:  * No tourniquets in log *  DICTATION: .Other Dictation: Dictation Number [id number]  PLAN OF CARE: Admit for overnight observation  PATIENT DISPOSITION:  PACU - hemodynamically stable.   Delay start of Pharmacological VTE agent (>24hrs) due to surgical blood loss or risk of bleeding: yes

## 2017-03-25 ENCOUNTER — Encounter (HOSPITAL_COMMUNITY): Payer: Self-pay | Admitting: Orthopedic Surgery

## 2017-03-25 DIAGNOSIS — M48062 Spinal stenosis, lumbar region with neurogenic claudication: Secondary | ICD-10-CM | POA: Diagnosis present

## 2017-03-25 MED ORDER — ASPIRIN 325 MG PO TABS: 325.0000 mg | ORAL_TABLET | Freq: Two times a day (BID) | ORAL | 0 refills | Status: DC

## 2017-03-25 MED ORDER — ACETAMINOPHEN 325 MG PO TABS
650.0000 mg | ORAL_TABLET | Freq: Four times a day (QID) | ORAL | Status: DC | PRN
  Administered 2017-03-25 (×2): 650 mg via ORAL
  Filled 2017-03-25: qty 2

## 2017-03-25 MED ORDER — HYDROMORPHONE HCL 2 MG PO TABS: 2.0000 mg | ORAL_TABLET | ORAL | 0 refills | Status: DC | PRN

## 2017-03-25 MED ORDER — ASPIRIN 325 MG PO TABS
325.0000 mg | ORAL_TABLET | Freq: Two times a day (BID) | ORAL | Status: DC
  Administered 2017-03-25: 10:00:00 325 mg via ORAL
  Filled 2017-03-25: qty 1

## 2017-03-25 NOTE — Discharge Summary (Signed)
Physician Discharge Summary   Patient ID: Justin [name] MRN: [id number] DOB/AGE: March 14, 1950 67 y.o.  Admit date: 03/24/2017 Discharge date: 03/25/2017  Primary Diagnosis: Lumbar spinal stenosis   Admission Diagnoses:  Past Medical History:  Diagnosis Date  . Acid reflux   . GERD (gastroesophageal reflux disease)   . History of kidney stones   . Hypertension   . IBS (irritable bowel syndrome)   . Kidney stones   . Sleep apnea   . Small bowel obstruction [facility])    Discharge Diagnoses:   Active Problems:   Hx of decompressive lumbar laminectomy   Neurogenic claudication due to lumbar spinal stenosis  Estimated body mass index is 28.62 kg/m as calculated from the following:   Height as of this encounter: 6' (1.829 m).   Weight as of this encounter: 95.7 kg (211 lb).  Procedure:  Procedure(s) (LRB): Complete Decompression Lumbar Laminectomy at L4-L5 for Spinal Stenosis, and Foraminotomy for L4 & L5 root bilaterally for foraminal stenosis. (Bilateral)   Consults: None  HPI: The patient presented with the chief complaint of low back pain. He has had this trouble for about 3 months. He has had progressive weakness and pain into the right leg and foot, as well as numbness and tingling. He denies an injury. He has seen his PCP and has had cortisone injection, oral corticosteroids, and NSAIDs with no relief. MRI of the lumbar spine showed severe spinal stenosis at L4-L5 on the right in addition to disc herniation at the same level. After discussion with the patient and his family, Dr. Gladstone [name] and the patient elected to proceed with central decompression and microdiscectomy.     Laboratory Data: Hospital Outpatient Visit on 03/15/2017  Component Date Value Ref Range Status  . aPTT 03/15/2017 28  24 - 36 seconds Final  . WBC 03/15/2017 4.6  4.0 - 10.5 K/uL Final  . RBC 03/15/2017 4.62  4.22 - 5.81 MIL/uL Final  . Hemoglobin 03/15/2017 13.8  13.0 - 17.0 g/dL Final  . HCT 03/15/2017  40.2  39.0 - 52.0 % Final  . MCV 03/15/2017 87.0  78.0 - 100.0 fL Final  . MCH 03/15/2017 29.9  26.0 - 34.0 pg Final  . MCHC 03/15/2017 34.3  30.0 - 36.0 g/dL Final  . RDW 03/15/2017 13.5  11.5 - 15.5 % Final  . Platelets 03/15/2017 148* 150 - 400 K/uL Final  . Neutrophils Relative % 03/15/2017 60  % Final  . Neutro Abs 03/15/2017 2.8  1.7 - 7.7 K/uL Final  . Lymphocytes Relative 03/15/2017 31  % Final  . Lymphs Abs 03/15/2017 1.4  0.7 - 4.0 K/uL Final  . Monocytes Relative 03/15/2017 8  % Final  . Monocytes Absolute 03/15/2017 0.4  0.1 - 1.0 K/uL Final  . Eosinophils Relative 03/15/2017 1  % Final  . Eosinophils Absolute 03/15/2017 0.1  0.0 - 0.7 K/uL Final  . Basophils Relative 03/15/2017 0  % Final  . Basophils Absolute 03/15/2017 0.0  0.0 - 0.1 K/uL Final  . Sodium 03/15/2017 140  135 - 145 mmol/L Final  . Potassium 03/15/2017 3.7  3.5 - 5.1 mmol/L Final  . Chloride 03/15/2017 110  101 - 111 mmol/L Final  . CO2 03/15/2017 24  22 - 32 mmol/L Final  . Glucose, Bld 03/15/2017 90  65 - 99 mg/dL Final  . BUN 03/15/2017 20  6 - 20 mg/dL Final  . Creatinine, Ser 03/15/2017 0.91  0.61 - 1.24 mg/dL Final  . Calcium 03/15/2017 9.0  8.9 - 10.3 mg/dL Final  . Total Protein 03/15/2017 6.5  6.5 - 8.1 g/dL Final  . Albumin 03/15/2017 4.0  3.5 - 5.0 g/dL Final  . AST 03/15/2017 22  15 - 41 U/L Final  . ALT 03/15/2017 19  17 - 63 U/L Final  . Alkaline Phosphatase 03/15/2017 50  38 - 126 U/L Final  . Total Bilirubin 03/15/2017 0.7  0.3 - 1.2 mg/dL Final  . GFR calc non Af Amer 03/15/2017 >60  >60 mL/min Final  . GFR calc Af Amer 03/15/2017 >60  >60 mL/min Final   Comment: (NOTE) The eGFR has been calculated using the CKD EPI equation. This calculation has not been validated in all clinical situations. eGFR's persistently <60 mL/min signify possible Chronic Kidney Disease.   . Anion gap 03/15/2017 6  5 - 15 Final  . Prothrombin Time 03/15/2017 14.7  11.4 - 15.2 seconds Final  . INR  03/15/2017 1.15   Final  . Color, Urine 03/15/2017 STRAW* YELLOW Final  . APPearance 03/15/2017 CLEAR  CLEAR Final  . Specific Gravity, Urine 03/15/2017 1.005  1.005 - 1.030 Final  . pH 03/15/2017 6.0  5.0 - 8.0 Final  . Glucose, UA 03/15/2017 NEGATIVE  NEGATIVE mg/dL Final  . Hgb urine dipstick 03/15/2017 NEGATIVE  NEGATIVE Final  . Bilirubin Urine 03/15/2017 NEGATIVE  NEGATIVE Final  . Ketones, ur 03/15/2017 NEGATIVE  NEGATIVE mg/dL Final  . Protein, ur 03/15/2017 NEGATIVE  NEGATIVE mg/dL Final  . Nitrite 03/15/2017 NEGATIVE  NEGATIVE Final  . Leukocytes, UA 03/15/2017 NEGATIVE  NEGATIVE Final  . MRSA, PCR 03/15/2017 NEGATIVE  NEGATIVE Final  . Staphylococcus aureus 03/15/2017 NEGATIVE  NEGATIVE Final   Comment: (NOTE) The Xpert SA Assay (FDA approved for NASAL specimens in patients 76 years of age and older), is one component of a comprehensive surveillance program. It is not intended to diagnose infection nor to guide or monitor treatment.      X-Rays:Dg Chest 2 View  Result Date: 03/15/2017 CLINICAL DATA:  Preoperative evaluation for upcoming lumbar laminectomy EXAM: CHEST  2 VIEW COMPARISON:  None. FINDINGS: The heart size and mediastinal contours are within normal limits. Both lungs are clear. The visualized skeletal structures show mild degenerative change of the thoracic spine. IMPRESSION: No active cardiopulmonary disease. Electronically Signed   By: Inez Catalina M.D.   On: 03/15/2017 16:00   Dg Lumbar Spine 2-3 Views  Result Date: 03/15/2017 CLINICAL DATA:  Preop for lumbar laminectomy.  Spinal numbering. EXAM: LUMBAR SPINE - 2-3 VIEW COMPARISON:  None. FINDINGS: Twelve ribs on contemporaneous chest x-ray. The twelfth ribs are small. There are 5 lumbar type vertebral bodies which are numbered as requested. Degenerative disc narrowing primarily at L2-3 and below. Asymmetric disc height loss with levoscoliosis. No evidence of fracture or bone lesion. 8 mm right renal calculus.  Cholecystectomy clips. IMPRESSION: 1. Five lumbar type vertebral bodies.  Small ribs at T12. 2. Diffuse disc degeneration with mild levoscoliosis. 3. Right nephrolithiasis. Electronically Signed   By: Monte Fantasia M.D.   On: 03/15/2017 16:03   Dg Spine Portable 1 View  Result Date: 03/24/2017 CLINICAL DATA:  Intraoperative film #3, lumbar localization EXAM: PORTABLE SPINE - 1 VIEW COMPARISON:  03/24/2017 at 1019 hours FINDINGS: Surgical probe posterior to the L4 vertebral body. Soft tissue spreaders posterior to L4 and L5. IMPRESSION: Lumbar localization as above. Electronically Signed   By: Julian Hy M.D.   On: 03/24/2017 11:26   Dg Spine Portable 1 View  Result  Date: 03/24/2017 CLINICAL DATA:  L4-5 lumbar decompression. EXAM: PORTABLE SPINE - 1 VIEW COMPARISON:  Intraoperative view earlier the same date. Preoperative radiographs 03/15/2017. FINDINGS: Cross-table lateral view labeled 2. Surgical instruments overlap the spinous processes at L4 and L5. The alignment is stable. Intervertebral spurring noted throughout the lumbar spine. IMPRESSION: Intraoperative localization as described. Electronically Signed   By: Richardean Sale M.D.   On: 03/24/2017 10:51   Dg Spine Portable 1 View  Result Date: 03/24/2017 CLINICAL DATA:  L4-5 decompression. EXAM: PORTABLE SPINE - 1 VIEW COMPARISON:  Radiographs of March 15, 2017. FINDINGS: Single intraoperative cross-table lateral projection of the lumbar spine demonstrates 1 surgical probe directed toward posterior elements of L5-S1, with another directed toward posterior portion of L5-S1 disc space. IMPRESSION: Surgical localization as described above. Electronically Signed   By: Marijo Conception, M.D.   On: 03/24/2017 10:37    EKG: Orders placed or performed during the hospital encounter of 03/15/17  . EKG  . EKG     Hospital Course: CARLISS QUAST is a 67 y.o. who was admitted to [facility]. They were brought to the operating room  on 03/24/2017 and underwent Procedure(s): Complete Decompression Lumbar Laminectomy at L4-L5 for Spinal Stenosis, and Foraminotomy for L4 & L5 root bilaterally for foraminal stenosis..  Patient tolerated the procedure well and was later transferred to the recovery room and then to the orthopaedic floor for postoperative care.  They were given PO and IV analgesics for pain control following their surgery.  They were given 24 hours of postoperative antibiotics of  Anti-infectives (From admission, onward)   Start     Dose/Rate Route Frequency Ordered Stop   03/24/17 1026  polymyxin B 500,000 Units, bacitracin 50,000 Units in sodium chloride 0.9 % 500 mL irrigation  Status:  Discontinued       As needed 03/24/17 1036 03/24/17 1217   03/24/17 0733  ceFAZolin (ANCEF) IVPB 2g/100 mL premix     2 g 200 mL/hr over 30 Minutes Intravenous On call to O.R. 03/24/17 3888 03/24/17 1018     and started on DVT prophylaxis in the form of Aspirin.  Discharge planning consulted to help with postop disposition and equipment needs.  Patient had a good night on the evening of surgery.  Dressing was changed and the incision was clean and dry. Incision was healing well. Patient was seen in rounds and was ready to go home.   Diet: Cardiac diet Activity:WBAT Follow-up:in 2 weeks Disposition - Home Discharged Condition: stable   Discharge Instructions    Call MD / Call 911   Complete by:  As directed    If you experience chest pain or shortness of breath, CALL 911 and be transported to the hospital emergency room.  If you develope a fever above 101 F, pus (white drainage) or increased drainage or redness at the wound, or calf pain, call your surgeon's office.   Constipation Prevention   Complete by:  As directed    Drink plenty of fluids.  Prune juice may be helpful.  You may use a stool softener, such as Colace (over the counter) 100 mg twice a day.  Use MiraLax (over the counter) for constipation as needed.   Diet -  low sodium heart healthy   Complete by:  As directed    Discharge instructions   Complete by:  As directed    For the first three days, remove your dressing, and tape a piece of saran wrap over  your incision Take your shower, then remove the saran wrap and put a clean dressing on. After three days you can shower without the saran wrap.  No lifting or excessive bending No driving while taking pain medications Call Dr. Gladstone [name] if any wound complications or temperature of 101 degrees F or over.  Call the office for an appointment to see Dr. Gladstone [name] in two weeks: [phone] and ask for Dr. Charlestine Night nurse, Brunilda Payor.   Increase activity slowly as tolerated   Complete by:  As directed      Allergies as of 03/25/2017   No Known Allergies     Medication List    TAKE these medications   albuterol 108 (90 Base) MCG/ACT inhaler Commonly known as:  PROVENTIL HFA;VENTOLIN HFA Inhale 2 puffs into the lungs every 6 (six) hours as needed for wheezing or shortness of breath. What changed:  Another medication with the same name was removed. Continue taking this medication, and follow the directions you see here.   ALIGN PO Take 1 capsule by mouth daily.   ALPRAZolam 1 MG tablet Commonly known as:  XANAX Take 1 mg by mouth at bedtime as needed for sleep.   ALPRAZolam 1 MG tablet Commonly known as:  XANAX Take 1 mg by mouth at bedtime as needed for sleep.   aspirin 325 MG tablet Take 1 tablet (325 mg total) by mouth 2 (two) times daily.   colchicine 0.6 MG tablet Take 1 tablet by mouth daily as needed (gout flare).   DEXILANT 60 MG capsule Generic drug:  dexlansoprazole Take 60 mg by mouth at bedtime. What changed:  Another medication with the same name was removed. Continue taking this medication, and follow the directions you see here.   esomeprazole 40 MG capsule Commonly known as:  NEXIUM Take 40 mg by mouth at bedtime.   HYDROmorphone 2 MG tablet Commonly known as:   DILAUDID Take 1 tablet (2 mg total) by mouth every 4 (four) hours as needed for moderate pain.   ibuprofen 200 MG tablet Commonly known as:  ADVIL,MOTRIN Take 400-800 mg by mouth every 6 (six) hours as needed for headache or moderate pain. What changed:  Another medication with the same name was removed. Continue taking this medication, and follow the directions you see here.   ketorolac 10 MG tablet Commonly known as:  TORADOL Take 10 mg by mouth every 6 (six) hours as needed.   losartan 50 MG tablet Commonly known as:  COZAAR Take 50 mg by mouth daily. What changed:  Another medication with the same name was removed. Continue taking this medication, and follow the directions you see here.   nebivolol 10 MG tablet Commonly known as:  BYSTOLIC Take 1 tablet (10 mg total) by mouth daily. Restart in 2 days. What changed:    how much to take  additional instructions   RESTASIS MULTIDOSE 0.05 % ophthalmic emulsion Generic drug:  cycloSPORINE Place 1 drop into both eyes daily.   tamsulosin 0.4 MG Caps capsule Commonly known as:  FLOMAX Take 0.4 mg by mouth as needed.      Follow-up Information    Latanya Maudlin, MD. Schedule an appointment as soon as possible for a visit in 2 week(s).   Specialty:  Orthopedic Surgery Contact information: [address] [place] [address] [phone]           Signed: Ardeen Jourdain, PA-C Orthopaedic Surgery 03/25/2017, 11:55 AM

## 2017-03-25 NOTE — Progress Notes (Signed)
Subjective: 1 Day Post-Op Procedure(s) (LRB): Complete Decompression Lumbar Laminectomy at L4-L5 for Spinal Stenosis, and Foraminotomy for L4 & L5 root bilaterally for foraminal stenosis. (Bilateral) Patient reports pain as 1 on 0-10 scale.No further leg pain and he said that his leg now feels normal.    Objective: Vital signs in last 24 hours: Temp:  [97.8 F (36.6 C)-98.8 F (37.1 C)] 98.8 F (37.1 C) (01/31 0521) Pulse Rate:  [68-115] 89 (01/31 0521) Resp:  [10-18] 16 (01/31 0521) BP: (112-169)/(65-96) 126/83 (01/31 0521) SpO2:  [93 %-99 %] 99 % (01/31 0521) Weight:  [95.7 kg (211 lb)] 95.7 kg (211 lb) (01/30 0802)  Intake/Output from previous day: 01/30 0701 - 01/31 0700 In: 2140 [P.O.:840; I.V.:1300] Out: 700 [Urine:600; Blood:100] Intake/Output this shift: No intake/output data recorded.  No results for input(s): HGB in the last 72 hours. No results for input(s): WBC, RBC, HCT, PLT in the last 72 hours. No results for input(s): NA, K, CL, CO2, BUN, CREATININE, GLUCOSE, CALCIUM in the last 72 hours. No results for input(s): LABPT, INR in the last 72 hours.  Neurologically intact Dorsiflexion/Plantar flexion intact  Assessment/Plan: 1 Day Post-Op Procedure(s) (LRB): Complete Decompression Lumbar Laminectomy at L4-L5 for Spinal Stenosis, and Foraminotomy for L4 & L5 root bilaterally for foraminal stenosis. (Bilateral) Up with therapy.Dc today.  Latanya Maudlin 03/25/2017, 7:19 AM

## 2017-03-25 NOTE — Discharge Instructions (Signed)

## 2017-08-08 ENCOUNTER — Emergency Department (HOSPITAL_COMMUNITY): Payer: 59

## 2017-08-08 ENCOUNTER — Other Ambulatory Visit: Payer: Self-pay

## 2017-08-08 ENCOUNTER — Emergency Department (HOSPITAL_COMMUNITY)
Admission: EM | Admit: 2017-08-08 | Discharge: 2017-08-08 | Disposition: A | Discharge status: Home / Self Care | Payer: 59 | Attending: Emergency Medicine | Admitting: Emergency Medicine

## 2017-08-08 ENCOUNTER — Encounter (HOSPITAL_COMMUNITY): Payer: Self-pay | Admitting: Emergency Medicine

## 2017-08-08 DIAGNOSIS — B9789 Other viral agents as the cause of diseases classified elsewhere: Secondary | ICD-10-CM | POA: Insufficient documentation

## 2017-08-08 DIAGNOSIS — J069 Acute upper respiratory infection, unspecified: Secondary | ICD-10-CM | POA: Diagnosis not present

## 2017-08-08 DIAGNOSIS — Z7982 Long term (current) use of aspirin: Secondary | ICD-10-CM | POA: Insufficient documentation

## 2017-08-08 DIAGNOSIS — I10 Essential (primary) hypertension: Secondary | ICD-10-CM | POA: Diagnosis not present

## 2017-08-08 DIAGNOSIS — Z79899 Other long term (current) drug therapy: Secondary | ICD-10-CM | POA: Diagnosis not present

## 2017-08-08 DIAGNOSIS — Z87891 Personal history of nicotine dependence: Secondary | ICD-10-CM | POA: Insufficient documentation

## 2017-08-08 DIAGNOSIS — J45909 Unspecified asthma, uncomplicated: Secondary | ICD-10-CM | POA: Diagnosis not present

## 2017-08-08 DIAGNOSIS — R05 Cough: Secondary | ICD-10-CM | POA: Diagnosis present

## 2017-08-08 HISTORY — DX: Unspecified asthma, uncomplicated: J45.909

## 2017-08-08 LAB — BASIC METABOLIC PANEL
Anion gap: 7 (ref 5–15)
BUN: 18 mg/dL (ref 6–20)
CO2: 27 mmol/L (ref 22–32)
Calcium: 9.1 mg/dL (ref 8.9–10.3)
Chloride: 106 mmol/L (ref 101–111)
Creatinine, Ser: 1.18 mg/dL (ref 0.61–1.24)
GFR calc Af Amer: >60 mL/min (ref >60)
GFR calc non Af Amer: >60 mL/min (ref >60)
Glucose, Bld: 105 mg/dL — ABNORMAL HIGH (ref 65–99)
Potassium: 3.9 mmol/L (ref 3.5–5.1)
Sodium: 140 mmol/L (ref 135–145)

## 2017-08-08 LAB — CBC WITH DIFFERENTIAL/PLATELET
Basophils Absolute: 0 10*3/uL (ref 0.0–0.1)
Basophils Relative: 0 %
Eosinophils Absolute: 0.2 10*3/uL (ref 0.0–0.7)
Eosinophils Relative: 5 %
HCT: 44.6 % (ref 39.0–52.0)
Hemoglobin: 15.1 g/dL (ref 13.0–17.0)
Lymphocytes Relative: 23 %
Lymphs Abs: 1 10*3/uL (ref 0.7–4.0)
MCH: 29.7 pg (ref 26.0–34.0)
MCHC: 33.9 g/dL (ref 30.0–36.0)
MCV: 87.8 fL (ref 78.0–100.0)
Monocytes Absolute: 0.4 10*3/uL (ref 0.1–1.0)
Monocytes Relative: 8 %
Neutro Abs: 2.9 10*3/uL (ref 1.7–7.7)
Neutrophils Relative %: 64 %
Platelets: 132 10*3/uL — ABNORMAL LOW (ref 150–400)
RBC: 5.08 MIL/uL (ref 4.22–5.81)
RDW: 12.9 % (ref 11.5–15.5)
WBC: 4.5 10*3/uL (ref 4.0–10.5)

## 2017-08-08 LAB — D-DIMER, QUANTITATIVE: D-Dimer, Quant: 0.29 ug/mL-FEU (ref 0.00–0.50)

## 2017-08-08 LAB — TROPONIN I: Troponin I: <0.03 ng/mL (ref <0.03)

## 2017-08-08 MED ORDER — PREDNISONE 20 MG PO TABS: 40.0000 mg | ORAL_TABLET | Freq: Every day | ORAL | 0 refills | Status: DC

## 2017-08-08 MED ORDER — AZITHROMYCIN 250 MG PO TABS: 250.0000 mg | ORAL_TABLET | Freq: Every day | ORAL | 0 refills | Status: DC

## 2017-08-08 MED ORDER — METHYLPREDNISOLONE SODIUM SUCC 40 MG IJ SOLR
40.0000 mg | Freq: Once | INTRAMUSCULAR | Status: AC
  Administered 2017-08-08: 40 mg via INTRAVENOUS
  Filled 2017-08-08: qty 1

## 2017-08-08 MED ORDER — ALBUTEROL SULFATE (2.5 MG/3ML) 0.083% IN NEBU
5.0000 mg | INHALATION_SOLUTION | Freq: Once | RESPIRATORY_TRACT | Status: AC
  Administered 2017-08-08: 5 mg via RESPIRATORY_TRACT
  Filled 2017-08-08: qty 6

## 2017-08-08 MED ORDER — ALBUTEROL SULFATE (5 MG/ML) 0.5% IN NEBU: 2.5000 mg | INHALATION_SOLUTION | Freq: Four times a day (QID) | RESPIRATORY_TRACT | 12 refills | Status: DC | PRN

## 2017-08-08 NOTE — ED Triage Notes (Signed)
Patient c/o non-productive cough with shortness of breath. Per patient hx of asthma in which he uses inhaler. Patient states started last week with sinus drainage in which he started taking benadryl. Patient reports cough started, used breathing treatment yesterday and Mucinex DM which helped briefly. Denies any fevers or chest pain.

## 2017-08-08 NOTE — ED Notes (Signed)
O2 Sats 99-100% on RA.

## 2017-08-13 NOTE — ED Provider Notes (Signed)
[facility] EMERGENCY DEPARTMENT Provider Note   CSN: [id number] Arrival date & time: 08/08/17  2993     History   Chief Complaint Chief Complaint  Patient presents with  . Cough    HPI Justin [name] is a 67 y.o. male.  HPI   67yM with cough and dyspnea. Onset about a week ago with sinus drainage. Took benadryl for this with improvement. A few days ago developed a cough which has been persistent. Feels sob. No fever. No swelling. No orthopnea. Some temporary relief when has breathing treatment.   Past Medical History:  Diagnosis Date  . Acid reflux   . Asthma   . GERD (gastroesophageal reflux disease)   . History of kidney stones   . Hypertension   . IBS (irritable bowel syndrome)   . Kidney stones   . Sleep apnea   . Small bowel obstruction [facility])     Patient Active Problem List   Diagnosis Date Noted  . Neurogenic claudication due to lumbar spinal stenosis 03/25/2017  . Hx of decompressive lumbar laminectomy 03/24/2017  . Thrombocytopenia ([place]) 05/28/2015  . Dehydration   . Small bowel obstruction ([place]) 05/27/2015  . Abdominal pain, epigastric 05/27/2015  . Nausea and vomiting 05/27/2015  . Diarrhea 05/27/2015  . Essential hypertension 05/27/2015  . SBO (small bowel obstruction) ([place]) 05/27/2015  . Acute kidney injury ([place]) 05/27/2015  . Encounter for screening colonoscopy 08/01/2012  . Fatty liver 08/01/2012  . Abdominal pain, unspecified site 07/12/2012    Past Surgical History:  Procedure Laterality Date  . ANTERIOR CRUCIATE LIGAMENT REPAIR    . APPENDECTOMY    . BACK SURGERY    . CHOLECYSTECTOMY  2003  . CHOLECYSTECTOMY    . COLONOSCOPY  June 2004   Dr. [name]: normal  . COLONOSCOPY N/A 08/31/2012   Procedure: COLONOSCOPY;  Surgeon: [name], MD;  Location: [facility] ENDO SUITE;  Service: Endoscopy;  Laterality: N/A;  8:30  . EXPLORATORY LAPAROTOMY     remote past  . KNEE SURGERY  1980's.   bilateral  . LUMBAR LAMINECTOMY/DECOMPRESSION  MICRODISCECTOMY Bilateral 03/24/2017   Procedure: Complete Decompression Lumbar Laminectomy at L4-L5 for Spinal Stenosis, and Foraminotomy for L4 & L5 root bilaterally for foraminal stenosis.;  Surgeon: Latanya Maudlin, MD;  Location: [facility] ORS;  Service: Orthopedics;  Laterality: Bilateral;        Home Medications    Prior to Admission medications   Medication Sig Start Date End Date Taking? Authorizing Provider  albuterol (PROVENTIL HFA;VENTOLIN HFA) 108 (90 Base) MCG/ACT inhaler Inhale 2 puffs into the lungs every 6 (six) hours as needed for wheezing or shortness of breath.   Yes [provider]  ALPRAZolam Duanne Moron) 1 MG tablet Take 1 mg by mouth at bedtime as needed for sleep.  06/06/12  Yes [provider]  aspirin 325 MG tablet Take 1 tablet (325 mg total) by mouth 2 (two) times daily. 03/25/17  Yes Constable, Amber, PA-C  DEXILANT 60 MG capsule Take 60 mg by mouth at bedtime.  02/09/17  Yes [provider]  ibuprofen (ADVIL,MOTRIN) 200 MG tablet Take 400-800 mg by mouth every 6 (six) hours as needed for headache or moderate pain.   Yes [provider]  losartan (COZAAR) 50 MG tablet Take 50 mg by mouth daily. 02/28/17  Yes [provider]  Probiotic Product (ALIGN PO) Take 1 capsule by mouth daily.   Yes [provider]  RESTASIS MULTIDOSE 0.05 % ophthalmic emulsion Place 1 drop into  both eyes daily. 01/28/17  Yes [provider]  albuterol (PROVENTIL) (5 MG/ML) 0.5% nebulizer solution Take 0.5 mLs (2.5 mg total) by nebulization every 6 (six) hours as needed for wheezing or shortness of breath. 08/08/17   Virgel Manifold, MD  azithromycin (ZITHROMAX) 250 MG tablet Take 1 tablet (250 mg total) by mouth daily. Take first 2 tablets together, then 1 every day until finished. 08/08/17   Virgel Manifold, MD  predniSONE (DELTASONE) 20 MG tablet Take 2 tablets (40 mg total) by mouth daily. 08/08/17   Virgel Manifold, MD    Family History Family  History  Problem Relation Age of Onset  . Cancer Sister        Breast  . Colon cancer Neg Hx     Social History Social History   Tobacco Use  . Smoking status: Never Smoker  . Smokeless tobacco: Former Systems developer    Types: Chew  Substance Use Topics  . Alcohol use: No    Frequency: Never  . Drug use: No     Allergies   Patient has no known allergies.   Review of Systems Review of Systems  All systems reviewed and negative, other than as noted in HPI.  Physical Exam Updated Vital Signs BP (!) 151/85   Pulse 64   Temp 97.8 F (36.6 C) (Oral)   Resp 17   Ht 6' (1.829 m)   Wt 97.1 kg (214 lb)   SpO2 100%   BMI 29.02 kg/m   Physical Exam  Constitutional: He appears well-developed and well-nourished. No distress.  HENT:  Head: Normocephalic and atraumatic.  Eyes: Conjunctivae are normal. Right eye exhibits no discharge. Left eye exhibits no discharge.  Neck: Neck supple.  Cardiovascular: Normal rate, regular rhythm and normal heart sounds. Exam reveals no gallop and no friction rub.  No murmur heard. Pulmonary/Chest: Effort normal and breath sounds normal. No respiratory distress.  Abdominal: Soft. He exhibits no distension. There is no tenderness.  Musculoskeletal: He exhibits no edema or tenderness.  Lower extremities symmetric as compared to each other. No calf tenderness. Negative Homan's. No palpable cords.   Neurological: He is alert.  Skin: Skin is warm and dry.  Psychiatric: He has a normal mood and affect. His behavior is normal. Thought content normal.  Nursing note and vitals reviewed.    ED Treatments / Results  Labs (all labs ordered are listed, but only abnormal results are displayed) Labs Reviewed  CBC WITH DIFFERENTIAL/PLATELET - Abnormal; Notable for the following components:      Result Value   Platelets 132 (*)    All other components within normal limits  BASIC METABOLIC PANEL - Abnormal; Notable for the following components:   Glucose,  Bld 105 (*)    All other components within normal limits  TROPONIN I  D-DIMER, QUANTITATIVE (NOT AT [facility])    EKG EKG Interpretation  Date/Time:  Sunday August 08 2017 09:43:18 EDT Ventricular Rate:  81 PR Interval:    QRS Duration: 102 QT Interval:  385 QTC Calculation: 447 R Axis:   40 Text Interpretation:  Sinus rhythm Confirmed by Fredia Sorrow [phone]) on 08/10/2017 12:06:54 AM   Radiology No results found.   Dg Chest 2 View  Result Date: 08/08/2017 CLINICAL DATA:  Nonproductive cough, shortness of breath EXAM: CHEST - 2 VIEW COMPARISON:  03/15/2017 FINDINGS: Heart and mediastinal contours are within normal limits. No focal opacities or effusions. No acute bony abnormality. IMPRESSION: No active cardiopulmonary disease. Electronically Signed   By:  Rolm Baptise M.D.   On: 08/08/2017 10:16    Procedures Procedures (including critical care time)  Medications Ordered in ED Medications  albuterol (PROVENTIL) (2.5 MG/3ML) 0.083% nebulizer solution 5 mg (5 mg Nebulization Given 08/08/17 1021)  methylPREDNISolone sodium succinate (SOLU-MEDROL) 40 mg/mL injection 40 mg (40 mg Intravenous Given 08/08/17 1106)     Initial Impression / Assessment and Plan / ED Course  I have reviewed the triage vital signs and the nursing notes.  Pertinent labs & imaging results that were available during my care of the patient were reviewed by me and considered in my medical decision making (see chart for details).     67yM with cough. Labs and CXR report reviewed. Likely viral illness. May be element of post nasal drip. Pt is going on a trip tomorrow and concerned about traveling with these symptoms. Prescribed azithromycin but advised not to start unless he wasn't feeling better after another couple days.   It has been determined that no acute conditions requiring further emergency intervention are present at this time. The patient has been advised of the diagnosis and plan. I reviewed any labs  and imaging including any potential incidental findings. We have discussed signs and symptoms that warrant return to the ED and they are listed in the discharge instructions.    Final Clinical Impressions(s) / ED Diagnoses   Final diagnoses:  Viral URI with cough    ED Discharge Orders        Ordered    albuterol (PROVENTIL) (5 MG/ML) 0.5% nebulizer solution  Every 6 hours PRN     08/08/17 1055    predniSONE (DELTASONE) 20 MG tablet  Daily     08/08/17 1055    azithromycin (ZITHROMAX) 250 MG tablet  Daily     08/08/17 1055       Virgel Manifold, MD 08/13/17 [phone]

## 2017-10-12 DIAGNOSIS — M545 Low back pain: Secondary | ICD-10-CM | POA: Diagnosis not present

## 2017-10-28 DIAGNOSIS — Z6830 Body mass index (BMI) 30.0-30.9, adult: Secondary | ICD-10-CM | POA: Diagnosis not present

## 2017-10-28 DIAGNOSIS — E785 Hyperlipidemia, unspecified: Secondary | ICD-10-CM | POA: Diagnosis not present

## 2017-10-28 DIAGNOSIS — R946 Abnormal results of thyroid function studies: Secondary | ICD-10-CM | POA: Diagnosis not present

## 2017-10-28 DIAGNOSIS — K56699 Other intestinal obstruction unspecified as to partial versus complete obstruction: Secondary | ICD-10-CM | POA: Diagnosis not present

## 2017-10-28 DIAGNOSIS — E748 Other specified disorders of carbohydrate metabolism: Secondary | ICD-10-CM | POA: Diagnosis not present

## 2017-10-28 DIAGNOSIS — Z23 Encounter for immunization: Secondary | ICD-10-CM | POA: Diagnosis not present

## 2017-10-28 DIAGNOSIS — N400 Enlarged prostate without lower urinary tract symptoms: Secondary | ICD-10-CM | POA: Diagnosis not present

## 2017-10-28 DIAGNOSIS — Z0001 Encounter for general adult medical examination with abnormal findings: Secondary | ICD-10-CM | POA: Diagnosis not present

## 2017-10-28 DIAGNOSIS — N486 Induration penis plastica: Secondary | ICD-10-CM | POA: Diagnosis not present

## 2017-10-28 DIAGNOSIS — D696 Thrombocytopenia, unspecified: Secondary | ICD-10-CM | POA: Diagnosis not present

## 2017-10-28 DIAGNOSIS — Z125 Encounter for screening for malignant neoplasm of prostate: Secondary | ICD-10-CM | POA: Diagnosis not present

## 2017-10-28 DIAGNOSIS — R7309 Other abnormal glucose: Secondary | ICD-10-CM | POA: Diagnosis not present

## 2017-10-28 DIAGNOSIS — M1990 Unspecified osteoarthritis, unspecified site: Secondary | ICD-10-CM | POA: Diagnosis not present

## 2017-10-28 DIAGNOSIS — N529 Male erectile dysfunction, unspecified: Secondary | ICD-10-CM | POA: Diagnosis not present

## 2017-10-28 DIAGNOSIS — Z1389 Encounter for screening for other disorder: Secondary | ICD-10-CM | POA: Diagnosis not present

## 2017-10-28 DIAGNOSIS — N200 Calculus of kidney: Secondary | ICD-10-CM | POA: Diagnosis not present

## 2017-10-28 DIAGNOSIS — F419 Anxiety disorder, unspecified: Secondary | ICD-10-CM | POA: Diagnosis not present

## 2017-10-28 DIAGNOSIS — K56600 Partial intestinal obstruction, unspecified as to cause: Secondary | ICD-10-CM | POA: Diagnosis not present

## 2017-11-26 DIAGNOSIS — M2550 Pain in unspecified joint: Secondary | ICD-10-CM | POA: Diagnosis not present

## 2017-11-26 DIAGNOSIS — I10 Essential (primary) hypertension: Secondary | ICD-10-CM | POA: Diagnosis not present

## 2017-11-26 DIAGNOSIS — Z6829 Body mass index (BMI) 29.0-29.9, adult: Secondary | ICD-10-CM | POA: Diagnosis not present

## 2017-11-26 DIAGNOSIS — M1991 Primary osteoarthritis, unspecified site: Secondary | ICD-10-CM | POA: Diagnosis not present

## 2017-11-26 DIAGNOSIS — K5669 Other partial intestinal obstruction: Secondary | ICD-10-CM | POA: Diagnosis not present

## 2017-12-07 DIAGNOSIS — R948 Abnormal results of function studies of other organs and systems: Secondary | ICD-10-CM | POA: Diagnosis not present

## 2017-12-13 DIAGNOSIS — R3915 Urgency of urination: Secondary | ICD-10-CM | POA: Diagnosis not present

## 2017-12-13 DIAGNOSIS — N401 Enlarged prostate with lower urinary tract symptoms: Secondary | ICD-10-CM | POA: Diagnosis not present

## 2017-12-13 DIAGNOSIS — N200 Calculus of kidney: Secondary | ICD-10-CM | POA: Diagnosis not present

## 2017-12-13 DIAGNOSIS — N5201 Erectile dysfunction due to arterial insufficiency: Secondary | ICD-10-CM | POA: Diagnosis not present

## 2018-02-10 DIAGNOSIS — K56609 Unspecified intestinal obstruction, unspecified as to partial versus complete obstruction: Secondary | ICD-10-CM | POA: Diagnosis not present

## 2018-02-10 DIAGNOSIS — E663 Overweight: Secondary | ICD-10-CM | POA: Diagnosis not present

## 2018-02-10 DIAGNOSIS — Z6828 Body mass index (BMI) 28.0-28.9, adult: Secondary | ICD-10-CM | POA: Diagnosis not present

## 2018-02-10 DIAGNOSIS — M1991 Primary osteoarthritis, unspecified site: Secondary | ICD-10-CM | POA: Diagnosis not present

## 2018-02-10 DIAGNOSIS — D696 Thrombocytopenia, unspecified: Secondary | ICD-10-CM | POA: Diagnosis not present

## 2018-02-10 DIAGNOSIS — I10 Essential (primary) hypertension: Secondary | ICD-10-CM | POA: Diagnosis not present

## 2018-02-10 DIAGNOSIS — N400 Enlarged prostate without lower urinary tract symptoms: Secondary | ICD-10-CM | POA: Diagnosis not present

## 2018-03-24 DIAGNOSIS — D2239 Melanocytic nevi of other parts of face: Secondary | ICD-10-CM | POA: Diagnosis not present

## 2018-03-24 DIAGNOSIS — C4441 Basal cell carcinoma of skin of scalp and neck: Secondary | ICD-10-CM | POA: Diagnosis not present

## 2018-05-05 DIAGNOSIS — K56609 Unspecified intestinal obstruction, unspecified as to partial versus complete obstruction: Secondary | ICD-10-CM | POA: Diagnosis not present

## 2018-05-05 DIAGNOSIS — Z9049 Acquired absence of other specified parts of digestive tract: Secondary | ICD-10-CM | POA: Diagnosis not present

## 2018-05-05 DIAGNOSIS — I10 Essential (primary) hypertension: Secondary | ICD-10-CM | POA: Diagnosis not present

## 2018-05-05 DIAGNOSIS — Z9889 Other specified postprocedural states: Secondary | ICD-10-CM | POA: Diagnosis not present

## 2018-05-09 ENCOUNTER — Other Ambulatory Visit: Payer: Self-pay | Admitting: General Surgery

## 2018-05-09 DIAGNOSIS — K56609 Unspecified intestinal obstruction, unspecified as to partial versus complete obstruction: Secondary | ICD-10-CM

## 2018-06-02 ENCOUNTER — Other Ambulatory Visit: Payer: Self-pay

## 2018-06-02 ENCOUNTER — Ambulatory Visit
Admission: RE | Admit: 2018-06-02 | Discharge: 2018-06-02 | Disposition: A | Discharge status: Home / Self Care | Payer: Medicare Other | Source: Ambulatory Visit | Attending: General Surgery | Admitting: General Surgery

## 2018-06-02 DIAGNOSIS — K56609 Unspecified intestinal obstruction, unspecified as to partial versus complete obstruction: Secondary | ICD-10-CM | POA: Diagnosis not present

## 2018-06-02 MED ORDER — IOPAMIDOL (ISOVUE-300) INJECTION 61%
100.0000 mL | Freq: Once | INTRAVENOUS | Status: AC | PRN
  Administered 2018-06-02: 100 mL via INTRAVENOUS

## 2018-06-06 DIAGNOSIS — I10 Essential (primary) hypertension: Secondary | ICD-10-CM | POA: Diagnosis not present

## 2018-06-06 DIAGNOSIS — Z9049 Acquired absence of other specified parts of digestive tract: Secondary | ICD-10-CM | POA: Diagnosis not present

## 2018-06-06 DIAGNOSIS — Z9889 Other specified postprocedural states: Secondary | ICD-10-CM | POA: Diagnosis not present

## 2018-06-06 DIAGNOSIS — K56609 Unspecified intestinal obstruction, unspecified as to partial versus complete obstruction: Secondary | ICD-10-CM | POA: Diagnosis not present

## 2018-07-28 DIAGNOSIS — M545 Low back pain: Secondary | ICD-10-CM | POA: Diagnosis not present

## 2018-10-28 DIAGNOSIS — Z6827 Body mass index (BMI) 27.0-27.9, adult: Secondary | ICD-10-CM | POA: Diagnosis not present

## 2018-10-28 DIAGNOSIS — M1991 Primary osteoarthritis, unspecified site: Secondary | ICD-10-CM | POA: Diagnosis not present

## 2018-10-28 DIAGNOSIS — G4700 Insomnia, unspecified: Secondary | ICD-10-CM | POA: Diagnosis not present

## 2018-10-28 DIAGNOSIS — F419 Anxiety disorder, unspecified: Secondary | ICD-10-CM | POA: Diagnosis not present

## 2018-10-28 DIAGNOSIS — Z23 Encounter for immunization: Secondary | ICD-10-CM | POA: Diagnosis not present

## 2018-11-11 DIAGNOSIS — H43812 Vitreous degeneration, left eye: Secondary | ICD-10-CM | POA: Diagnosis not present

## 2018-11-11 DIAGNOSIS — I739 Peripheral vascular disease, unspecified: Secondary | ICD-10-CM | POA: Diagnosis not present

## 2018-12-09 ENCOUNTER — Other Ambulatory Visit: Payer: Self-pay

## 2018-12-09 ENCOUNTER — Other Ambulatory Visit (HOSPITAL_COMMUNITY): Payer: Self-pay | Admitting: Internal Medicine

## 2018-12-09 ENCOUNTER — Ambulatory Visit (HOSPITAL_COMMUNITY)
Admission: RE | Admit: 2018-12-09 | Discharge: 2018-12-09 | Disposition: A | Discharge status: Home / Self Care | Payer: Medicare Other | Source: Ambulatory Visit | Attending: Internal Medicine | Admitting: Internal Medicine

## 2018-12-09 DIAGNOSIS — I10 Essential (primary) hypertension: Secondary | ICD-10-CM | POA: Diagnosis not present

## 2018-12-09 DIAGNOSIS — Z0001 Encounter for general adult medical examination with abnormal findings: Secondary | ICD-10-CM | POA: Diagnosis not present

## 2018-12-09 DIAGNOSIS — F419 Anxiety disorder, unspecified: Secondary | ICD-10-CM | POA: Diagnosis not present

## 2018-12-09 DIAGNOSIS — R0789 Other chest pain: Secondary | ICD-10-CM | POA: Diagnosis not present

## 2018-12-09 DIAGNOSIS — Z6828 Body mass index (BMI) 28.0-28.9, adult: Secondary | ICD-10-CM | POA: Diagnosis not present

## 2018-12-09 DIAGNOSIS — E663 Overweight: Secondary | ICD-10-CM | POA: Diagnosis not present

## 2018-12-09 DIAGNOSIS — H43811 Vitreous degeneration, right eye: Secondary | ICD-10-CM | POA: Diagnosis not present

## 2018-12-09 DIAGNOSIS — R079 Chest pain, unspecified: Secondary | ICD-10-CM | POA: Diagnosis not present

## 2018-12-09 DIAGNOSIS — M1990 Unspecified osteoarthritis, unspecified site: Secondary | ICD-10-CM | POA: Diagnosis not present

## 2018-12-09 DIAGNOSIS — R071 Chest pain on breathing: Secondary | ICD-10-CM | POA: Diagnosis not present

## 2018-12-26 DIAGNOSIS — N200 Calculus of kidney: Secondary | ICD-10-CM | POA: Diagnosis not present

## 2018-12-26 DIAGNOSIS — R351 Nocturia: Secondary | ICD-10-CM | POA: Diagnosis not present

## 2018-12-26 DIAGNOSIS — N5201 Erectile dysfunction due to arterial insufficiency: Secondary | ICD-10-CM | POA: Diagnosis not present

## 2018-12-26 DIAGNOSIS — N401 Enlarged prostate with lower urinary tract symptoms: Secondary | ICD-10-CM | POA: Diagnosis not present

## 2019-01-09 DIAGNOSIS — D696 Thrombocytopenia, unspecified: Secondary | ICD-10-CM | POA: Diagnosis not present

## 2019-01-11 DIAGNOSIS — M7541 Impingement syndrome of right shoulder: Secondary | ICD-10-CM | POA: Diagnosis not present

## 2019-01-11 DIAGNOSIS — M25511 Pain in right shoulder: Secondary | ICD-10-CM | POA: Diagnosis not present

## 2019-01-11 DIAGNOSIS — M7521 Bicipital tendinitis, right shoulder: Secondary | ICD-10-CM | POA: Diagnosis not present

## 2019-01-11 DIAGNOSIS — M75101 Unspecified rotator cuff tear or rupture of right shoulder, not specified as traumatic: Secondary | ICD-10-CM | POA: Diagnosis not present

## 2019-01-11 DIAGNOSIS — M19019 Primary osteoarthritis, unspecified shoulder: Secondary | ICD-10-CM | POA: Diagnosis not present

## 2019-02-23 DIAGNOSIS — N182 Chronic kidney disease, stage 2 (mild): Secondary | ICD-10-CM | POA: Diagnosis not present

## 2019-02-23 DIAGNOSIS — M1990 Unspecified osteoarthritis, unspecified site: Secondary | ICD-10-CM | POA: Diagnosis not present

## 2019-02-23 DIAGNOSIS — I129 Hypertensive chronic kidney disease with stage 1 through stage 4 chronic kidney disease, or unspecified chronic kidney disease: Secondary | ICD-10-CM | POA: Diagnosis not present

## 2019-04-20 DIAGNOSIS — Z23 Encounter for immunization: Secondary | ICD-10-CM | POA: Diagnosis not present

## 2019-05-19 DIAGNOSIS — Z23 Encounter for immunization: Secondary | ICD-10-CM | POA: Diagnosis not present

## 2019-05-24 DIAGNOSIS — N182 Chronic kidney disease, stage 2 (mild): Secondary | ICD-10-CM | POA: Diagnosis not present

## 2019-05-24 DIAGNOSIS — M1990 Unspecified osteoarthritis, unspecified site: Secondary | ICD-10-CM | POA: Diagnosis not present

## 2019-05-24 DIAGNOSIS — I129 Hypertensive chronic kidney disease with stage 1 through stage 4 chronic kidney disease, or unspecified chronic kidney disease: Secondary | ICD-10-CM | POA: Diagnosis not present

## 2019-07-24 DIAGNOSIS — N182 Chronic kidney disease, stage 2 (mild): Secondary | ICD-10-CM | POA: Diagnosis not present

## 2019-07-24 DIAGNOSIS — I129 Hypertensive chronic kidney disease with stage 1 through stage 4 chronic kidney disease, or unspecified chronic kidney disease: Secondary | ICD-10-CM | POA: Diagnosis not present

## 2019-07-24 DIAGNOSIS — M1990 Unspecified osteoarthritis, unspecified site: Secondary | ICD-10-CM | POA: Diagnosis not present

## 2019-09-07 DIAGNOSIS — M545 Low back pain: Secondary | ICD-10-CM | POA: Diagnosis not present

## 2019-09-22 DIAGNOSIS — M1990 Unspecified osteoarthritis, unspecified site: Secondary | ICD-10-CM | POA: Diagnosis not present

## 2019-09-22 DIAGNOSIS — I129 Hypertensive chronic kidney disease with stage 1 through stage 4 chronic kidney disease, or unspecified chronic kidney disease: Secondary | ICD-10-CM | POA: Diagnosis not present

## 2019-09-22 DIAGNOSIS — N182 Chronic kidney disease, stage 2 (mild): Secondary | ICD-10-CM | POA: Diagnosis not present

## 2019-11-23 DIAGNOSIS — M1990 Unspecified osteoarthritis, unspecified site: Secondary | ICD-10-CM | POA: Diagnosis not present

## 2019-11-23 DIAGNOSIS — I129 Hypertensive chronic kidney disease with stage 1 through stage 4 chronic kidney disease, or unspecified chronic kidney disease: Secondary | ICD-10-CM | POA: Diagnosis not present

## 2019-11-23 DIAGNOSIS — N182 Chronic kidney disease, stage 2 (mild): Secondary | ICD-10-CM | POA: Diagnosis not present

## 2019-12-12 DIAGNOSIS — N529 Male erectile dysfunction, unspecified: Secondary | ICD-10-CM | POA: Diagnosis not present

## 2019-12-12 DIAGNOSIS — F419 Anxiety disorder, unspecified: Secondary | ICD-10-CM | POA: Diagnosis not present

## 2019-12-12 DIAGNOSIS — N182 Chronic kidney disease, stage 2 (mild): Secondary | ICD-10-CM | POA: Diagnosis not present

## 2019-12-12 DIAGNOSIS — M47816 Spondylosis without myelopathy or radiculopathy, lumbar region: Secondary | ICD-10-CM | POA: Diagnosis not present

## 2019-12-12 DIAGNOSIS — Z1331 Encounter for screening for depression: Secondary | ICD-10-CM | POA: Diagnosis not present

## 2019-12-12 DIAGNOSIS — Z6827 Body mass index (BMI) 27.0-27.9, adult: Secondary | ICD-10-CM | POA: Diagnosis not present

## 2019-12-12 DIAGNOSIS — Z23 Encounter for immunization: Secondary | ICD-10-CM | POA: Diagnosis not present

## 2019-12-12 DIAGNOSIS — I10 Essential (primary) hypertension: Secondary | ICD-10-CM | POA: Diagnosis not present

## 2019-12-12 DIAGNOSIS — K219 Gastro-esophageal reflux disease without esophagitis: Secondary | ICD-10-CM | POA: Diagnosis not present

## 2019-12-12 DIAGNOSIS — Z1389 Encounter for screening for other disorder: Secondary | ICD-10-CM | POA: Diagnosis not present

## 2019-12-12 DIAGNOSIS — Z0001 Encounter for general adult medical examination with abnormal findings: Secondary | ICD-10-CM | POA: Diagnosis not present

## 2019-12-12 DIAGNOSIS — M109 Gout, unspecified: Secondary | ICD-10-CM | POA: Diagnosis not present

## 2019-12-23 DIAGNOSIS — N182 Chronic kidney disease, stage 2 (mild): Secondary | ICD-10-CM | POA: Diagnosis not present

## 2019-12-23 DIAGNOSIS — I129 Hypertensive chronic kidney disease with stage 1 through stage 4 chronic kidney disease, or unspecified chronic kidney disease: Secondary | ICD-10-CM | POA: Diagnosis not present

## 2019-12-23 DIAGNOSIS — M1990 Unspecified osteoarthritis, unspecified site: Secondary | ICD-10-CM | POA: Diagnosis not present

## 2019-12-25 DIAGNOSIS — N200 Calculus of kidney: Secondary | ICD-10-CM | POA: Diagnosis not present

## 2019-12-25 DIAGNOSIS — R3915 Urgency of urination: Secondary | ICD-10-CM | POA: Diagnosis not present

## 2019-12-25 DIAGNOSIS — N5201 Erectile dysfunction due to arterial insufficiency: Secondary | ICD-10-CM | POA: Diagnosis not present

## 2019-12-25 DIAGNOSIS — N401 Enlarged prostate with lower urinary tract symptoms: Secondary | ICD-10-CM | POA: Diagnosis not present

## 2020-01-09 DIAGNOSIS — M6210 Other rupture of muscle (nontraumatic), unspecified site: Secondary | ICD-10-CM | POA: Diagnosis not present

## 2020-01-09 DIAGNOSIS — Z6827 Body mass index (BMI) 27.0-27.9, adult: Secondary | ICD-10-CM | POA: Diagnosis not present

## 2020-01-09 DIAGNOSIS — E663 Overweight: Secondary | ICD-10-CM | POA: Diagnosis not present

## 2020-01-09 DIAGNOSIS — K219 Gastro-esophageal reflux disease without esophagitis: Secondary | ICD-10-CM | POA: Diagnosis not present

## 2020-01-23 DIAGNOSIS — N182 Chronic kidney disease, stage 2 (mild): Secondary | ICD-10-CM | POA: Diagnosis not present

## 2020-01-23 DIAGNOSIS — M1990 Unspecified osteoarthritis, unspecified site: Secondary | ICD-10-CM | POA: Diagnosis not present

## 2020-01-23 DIAGNOSIS — I129 Hypertensive chronic kidney disease with stage 1 through stage 4 chronic kidney disease, or unspecified chronic kidney disease: Secondary | ICD-10-CM | POA: Diagnosis not present

## 2020-01-24 DIAGNOSIS — Z23 Encounter for immunization: Secondary | ICD-10-CM | POA: Diagnosis not present

## 2020-02-19 DIAGNOSIS — Z6828 Body mass index (BMI) 28.0-28.9, adult: Secondary | ICD-10-CM | POA: Diagnosis not present

## 2020-02-19 DIAGNOSIS — I700 Atherosclerosis of aorta: Secondary | ICD-10-CM | POA: Diagnosis not present

## 2020-02-19 DIAGNOSIS — I10 Essential (primary) hypertension: Secondary | ICD-10-CM | POA: Diagnosis not present

## 2020-02-19 DIAGNOSIS — K219 Gastro-esophageal reflux disease without esophagitis: Secondary | ICD-10-CM | POA: Diagnosis not present

## 2020-02-20 ENCOUNTER — Other Ambulatory Visit: Payer: Self-pay

## 2020-02-20 ENCOUNTER — Ambulatory Visit (HOSPITAL_COMMUNITY)
Admission: RE | Admit: 2020-02-20 | Discharge: 2020-02-20 | Disposition: A | Discharge status: Home / Self Care | Payer: Medicare Other | Source: Ambulatory Visit | Attending: Internal Medicine | Admitting: Internal Medicine

## 2020-02-20 ENCOUNTER — Other Ambulatory Visit (HOSPITAL_COMMUNITY): Payer: Self-pay | Admitting: Internal Medicine

## 2020-02-20 DIAGNOSIS — Z6828 Body mass index (BMI) 28.0-28.9, adult: Secondary | ICD-10-CM | POA: Diagnosis not present

## 2020-02-20 DIAGNOSIS — M79641 Pain in right hand: Secondary | ICD-10-CM | POA: Diagnosis not present

## 2020-02-20 DIAGNOSIS — S6991XA Unspecified injury of right wrist, hand and finger(s), initial encounter: Secondary | ICD-10-CM

## 2020-02-20 DIAGNOSIS — S60221A Contusion of right hand, initial encounter: Secondary | ICD-10-CM | POA: Diagnosis not present

## 2020-02-20 DIAGNOSIS — M7989 Other specified soft tissue disorders: Secondary | ICD-10-CM | POA: Diagnosis not present

## 2020-02-20 DIAGNOSIS — K219 Gastro-esophageal reflux disease without esophagitis: Secondary | ICD-10-CM | POA: Diagnosis not present

## 2020-02-20 DIAGNOSIS — I10 Essential (primary) hypertension: Secondary | ICD-10-CM | POA: Diagnosis not present

## 2020-03-13 DIAGNOSIS — I700 Atherosclerosis of aorta: Secondary | ICD-10-CM | POA: Diagnosis not present

## 2020-03-13 DIAGNOSIS — I10 Essential (primary) hypertension: Secondary | ICD-10-CM | POA: Diagnosis not present

## 2020-03-13 DIAGNOSIS — M67843 Other specified disorders of tendon, right hand: Secondary | ICD-10-CM | POA: Diagnosis not present

## 2020-03-13 DIAGNOSIS — D696 Thrombocytopenia, unspecified: Secondary | ICD-10-CM | POA: Diagnosis not present

## 2020-03-13 DIAGNOSIS — Z6828 Body mass index (BMI) 28.0-28.9, adult: Secondary | ICD-10-CM | POA: Diagnosis not present

## 2020-03-23 DIAGNOSIS — M1990 Unspecified osteoarthritis, unspecified site: Secondary | ICD-10-CM | POA: Diagnosis not present

## 2020-03-23 DIAGNOSIS — I129 Hypertensive chronic kidney disease with stage 1 through stage 4 chronic kidney disease, or unspecified chronic kidney disease: Secondary | ICD-10-CM | POA: Diagnosis not present

## 2020-03-23 DIAGNOSIS — N182 Chronic kidney disease, stage 2 (mild): Secondary | ICD-10-CM | POA: Diagnosis not present

## 2020-04-04 DIAGNOSIS — M79641 Pain in right hand: Secondary | ICD-10-CM | POA: Diagnosis not present

## 2020-04-04 DIAGNOSIS — S60221A Contusion of right hand, initial encounter: Secondary | ICD-10-CM | POA: Diagnosis not present

## 2020-04-05 DIAGNOSIS — I10 Essential (primary) hypertension: Secondary | ICD-10-CM | POA: Diagnosis not present

## 2020-04-05 DIAGNOSIS — Z6827 Body mass index (BMI) 27.0-27.9, adult: Secondary | ICD-10-CM | POA: Diagnosis not present

## 2020-04-05 DIAGNOSIS — K660 Peritoneal adhesions (postprocedural) (postinfection): Secondary | ICD-10-CM | POA: Diagnosis not present

## 2020-04-05 DIAGNOSIS — M1991 Primary osteoarthritis, unspecified site: Secondary | ICD-10-CM | POA: Diagnosis not present

## 2020-04-05 DIAGNOSIS — I700 Atherosclerosis of aorta: Secondary | ICD-10-CM | POA: Diagnosis not present

## 2020-04-05 DIAGNOSIS — R002 Palpitations: Secondary | ICD-10-CM | POA: Diagnosis not present

## 2020-04-17 ENCOUNTER — Encounter: Payer: Self-pay | Admitting: *Deleted

## 2020-04-17 ENCOUNTER — Ambulatory Visit (INDEPENDENT_AMBULATORY_CARE_PROVIDER_SITE_OTHER): Payer: Medicare Other | Admitting: Cardiovascular Disease

## 2020-04-17 ENCOUNTER — Ambulatory Visit (INDEPENDENT_AMBULATORY_CARE_PROVIDER_SITE_OTHER): Payer: Medicare Other

## 2020-04-17 ENCOUNTER — Other Ambulatory Visit: Payer: Self-pay

## 2020-04-17 ENCOUNTER — Encounter: Payer: Self-pay | Admitting: Cardiovascular Disease

## 2020-04-17 VITALS — BP 174/86 | HR 57 | Ht 72.0 in | Wt 210.8 lb

## 2020-04-17 DIAGNOSIS — I10 Essential (primary) hypertension: Secondary | ICD-10-CM | POA: Diagnosis not present

## 2020-04-17 DIAGNOSIS — R000 Tachycardia, unspecified: Secondary | ICD-10-CM | POA: Diagnosis not present

## 2020-04-17 DIAGNOSIS — R002 Palpitations: Secondary | ICD-10-CM

## 2020-04-17 NOTE — Progress Notes (Signed)
Cardiology Office Note:   Date:  04/17/2020  NAME:  Justin [name]    MRN: [id number] DOB:  03-28-1950   PCP:  Redmond School, MD  Cardiologist:  No primary care provider on file.   Referring MD: Redmond School, MD   Chief Complaint  Patient presents with  . Palpitations        History of Present Illness:   Justin [name] is a 70 y.o. male with a hx of GERD, HTN who is being seen today for the evaluation of palpitations at the request of Redmond School, MD.  He reports for the last few weeks he has felt weakness and fatigue.  He apparently has checked his blood pressure and noticed an irregular heartbeat.  He was evaluated by his primary care physician who recommended further evaluation with [place].  He reports that blood pressure is fluctuating.  He presents with his wife.  His wife is related to [facility] with a patient of mine.  This is his sister.  He apparently has felt dizzy and lightheaded intermittently.  Also noticed an irregular heartbeat.  He does not feel palpitations but apparently his heart monitor reports an irregular rhythm.  His EKG today demonstrates sinus bradycardia heart rate 57.  His blood pressure is elevated today 174/86.  He takes losartan 25 twice a day.  His primary care physician did increase his losartan to 50 mg twice a day but he was unable to take this.  He has never had a heart attack or stroke.  Most recent lipid profile unremarkable.  LDL cholesterol 87.  Most recent TSH 1.67.  Hemoglobin stable 14.5.  He reports no exertional chest pain or pressure.  No shortness of breath.  He does has intermittent dizziness and lightheadedness as well as fatigue.  Again, he does not notice any rapid irregular heartbeat he just notices this on his monitor.  No strong family history of heart disease.  He is a never smoker.  No alcohol or drug use reported.  He is retired.  He denies any symptoms in office.  EKG unremarkable.  Problem list 1.  Hypertension 2.   Hyperlipidemia -Total cholesterol 204, HDL 61, LDL 87, triglycerides 145  Past Medical History: Past Medical History:  Diagnosis Date  . Acid reflux   . Asthma   . GERD (gastroesophageal reflux disease)   . History of kidney stones   . Hypertension   . IBS (irritable bowel syndrome)   . Kidney stones   . Sleep apnea   . Small bowel obstruction [facility])     Past Surgical History: Past Surgical History:  Procedure Laterality Date  . ANTERIOR CRUCIATE LIGAMENT REPAIR    . APPENDECTOMY    . BACK SURGERY    . CHOLECYSTECTOMY  2003  . CHOLECYSTECTOMY    . COLONOSCOPY  June 2004   Dr. [name]: normal  . COLONOSCOPY N/A 08/31/2012   Procedure: COLONOSCOPY;  Surgeon: [name], MD;  Location: [facility];  Service: Endoscopy;  Laterality: N/A;  8:30  . EXPLORATORY LAPAROTOMY     remote past  . KNEE SURGERY  1980's.   bilateral  . LUMBAR LAMINECTOMY/DECOMPRESSION MICRODISCECTOMY Bilateral 03/24/2017   Procedure: Complete Decompression Lumbar Laminectomy at L4-L5 for Spinal Stenosis, and Foraminotomy for L4 & L5 root bilaterally for foraminal stenosis.;  Surgeon: Latanya Maudlin, MD;  Location: [facility] ORS;  Service: Orthopedics;  Laterality: Bilateral;    Current Medications: Current Meds  Medication Sig  . ALPRAZolam (XANAX) 1 MG  tablet Take 1 mg by mouth at bedtime as needed for sleep.   Marland Kitchen losartan (COZAAR) 50 MG tablet Take 50 mg by mouth daily.  . Probiotic Product (ALIGN PO) Take 1 capsule by mouth daily.  . RESTASIS MULTIDOSE 0.05 % ophthalmic emulsion Place 1 drop into both eyes daily.     Allergies:    Patient has no known allergies.   Social History: Social History   Socioeconomic History  . Marital status: Married    Spouse name: Jana Half  . Number of children: 3  . Years of education: 14  . Highest education level: Not on file  Occupational History  . Occupation: Chartered certified accountant: [employer]    Comment: over 7 plants in [place]  Tobacco Use  . Smoking status: Never Smoker  . Smokeless tobacco: Former Systems developer    Types: Secondary school teacher  . Vaping Use: Never used  Substance and Sexual Activity  . Alcohol use: No  . Drug use: No  . Sexual activity: Not on file  Other Topics Concern  . Not on file  Social History Narrative   ** Merged History Encounter **       Consumes caffeine occas, is left handed   Social Determinants of Radio broadcast assistant Strain: Not on file  Food Insecurity: Not on file  Transportation Needs: Not on file  Physical Activity: Not on file  Stress: Not on file  Social Connections: Not on file     Family History: The patient's family history includes Brain cancer in his mother; Cancer in his sister. There is no history of Colon cancer.  ROS:   All other ROS reviewed and negative. Pertinent positives noted in the HPI.     EKGs/Labs/Other Studies Reviewed:   The following studies were personally reviewed by me today:  EKG:  EKG is ordered today.  The ekg ordered today demonstrates sinus bradycardia heart rate 57, no acute ischemic changes, no evidence of infarction, and was personally reviewed by me.   Recent Labs: No results found for requested labs within last 8760 hours.   Recent Lipid Panel No results found for: CHOL, TRIG, HDL, CHOLHDL, VLDL, LDLCALC, LDLDIRECT  Physical Exam:   VS:  BP (!) 174/86   Pulse (!) 57   Ht 6' (1.829 m)   Wt 210 lb 12.8 oz (95.6 kg)   SpO2 99%   BMI 28.59 kg/m    Wt Readings from Last 3 Encounters:  04/17/20 210 lb 12.8 oz (95.6 kg)  08/08/17 214 lb (97.1 kg)  03/24/17 211 lb (95.7 kg)    General: Well nourished, well developed, in no acute distress Head: Atraumatic, normal size  Eyes: PEERLA, EOMI  Neck: Supple, no JVD Endocrine: No thryomegaly Cardiac: Normal S1, S2; RRR; no murmurs, rubs, or gallops Lungs: Clear to auscultation bilaterally, no wheezing, rhonchi or rales  Abd: Soft, nontender, no  hepatomegaly  Ext: No edema, pulses 2+ Musculoskeletal: No deformities, BUE and BLE strength normal and equal Skin: Warm and dry, no rashes   Neuro: Alert and oriented to person, place, time, and situation, CNII-XII grossly intact, no focal deficits  Psych: Normal mood and affect   ASSESSMENT:   Justin [name] is a 70 y.o. male who presents for the following: 1. Palpitations   2. Tachycardia   3. Primary hypertension     PLAN:   1. Palpitations 2. Tachycardia -Intermittent episodes of irregular  heartbeat.  EKG in office demonstrates sinus rhythm.  No ischemic changes or evidence of infarction.  Cardiovascular examination is normal.  Could represent an arrhythmia versus ectopy.  I recommended a TSH.  We will proceed with a 7-day Zio patch.  3. Primary hypertension -Regarding blood pressure he was given information about salt reductive strategies.  He will keep a blood pressure log checking his blood pressure twice a day.  We will determine where his blood pressure is elevated.  I also recommended coronary calcium score for further risk ratification.  He may need to be on a statin.  Disposition: Return in about 2 months (around 06/15/2020).  Medication Adjustments/Labs and Tests Ordered: Current medicines are reviewed at length with the patient today.  Concerns regarding medicines are outlined above.  Orders Placed This Encounter  Procedures  . CT CARDIAC SCORING (SELF PAY ONLY)  . TSH  . LONG TERM MONITOR (3-14 DAYS)  . EKG 12-Lead   No orders of the defined types were placed in this encounter.   Patient Instructions  Medication Instructions:  The current medical regimen is effective;  continue present plan and medications.  *If you need a refill on your cardiac medications before your next appointment, please call your pharmacy*   Lab Work: TSH today  If you have labs (blood work) drawn today and your tests are completely normal, you will receive your results only  by: Marland Kitchen MyChart Message (if you have MyChart) OR . A paper copy in the mail If you have any lab test that is abnormal or we need to change your treatment, we will call you to review the results.   Testing/Procedures: CALCIUM SCORE  ZIO XT- Long Term Monitor Instructions   Your physician has requested you wear your ZIO patch monitor_______days.   This is a single patch monitor.  [employer] supplies one patch monitor per enrollment.  Additional stickers are not available.   Please do not apply patch if you will be having a Nuclear Stress Test, Echocardiogram, Cardiac CT, MRI, or Chest Xray during the time frame you would be wearing the monitor. The patch cannot be worn during these tests.  You cannot remove and re-apply the ZIO XT patch monitor.   Your ZIO patch monitor will be sent USPS Priority mail from [facility] directly to your home address. The monitor may also be mailed to a PO BOX if home delivery is not available.   It may take 3-5 days to receive your monitor after you have been enrolled.   Once you have received you monitor, please review enclosed instructions.  Your monitor has already been registered assigning a specific monitor serial # to you.   Applying the monitor   Shave hair from upper left chest.   Hold abrader disc by orange tab.  Rub abrader in 40 strokes over left upper chest as indicated in your monitor instructions.   Clean area with 4 enclosed alcohol pads .  Use all pads to assure are is cleaned thoroughly.  Let dry.   Apply patch as indicated in monitor instructions.  Patch will be place under collarbone on left side of chest with arrow pointing upward.   Rub patch adhesive wings for 2 minutes.Remove white label marked "1".  Remove white label marked "2".  Rub patch adhesive wings for 2 additional minutes.   While looking in a mirror, press and release button in center of patch.  A small green light will flash 3-4 times .  This will  be your only  indicator the monitor has been turned on.     Do not shower for the first 24 hours.  You may shower after the first 24 hours.   Press button if you feel a symptom. You will hear a small click.  Record Date, Time and Symptom in the Patient Log Book.   When you are ready to remove patch, follow instructions on last 2 pages of Patient Log Book.  Stick patch monitor onto last page of Patient Log Book.   Place Patient Log Book in [place] box.  Use locking tab on box and tape box closed securely.  The [employer] and [employer] has [employer] on it.  Please place in mailbox as soon as possible.  Your physician should have your test results approximately 7 days after the monitor has been mailed back to [facility].   Call [place] at [phone] if you have questions regarding your ZIO XT patch monitor.  Call them immediately if you see an orange light blinking on your monitor.   If your monitor falls off in less than 4 days contact our Monitor department at [phone].  If your monitor becomes loose or falls off after 4 days call [employer] at [phone] for suggestions on securing your monitor.     Follow-Up: At [facility], you and your health needs are our priority.  As part of our continuing mission to provide you with exceptional heart care, we have created designated Provider Care Teams.  These Care Teams include your primary Cardiologist (physician) and Advanced Practice Providers (APPs -  Physician Assistants and Nurse Practitioners) who all work together to provide you with the care you need, when you need it.  We recommend signing up for the patient portal called "MyChart".  Sign up information is provided on this After Visit Summary.  MyChart is used to connect with patients for Virtual Visits (Telemedicine).  Patients are able to view lab/test results, encounter notes, upcoming appointments, etc.  Non-urgent messages can be sent to your provider as well.   To  learn more about what you can do with MyChart, go to [email or web address].    Your next appointment:   2 month(s)  The format for your next appointment:   In Person  Provider:   Eleonore Chiquito, MD         Signed, Addison Naegeli. Audie Box, [place]  [address], [place], [place] [address] [phone]  04/17/2020 5:13 PM

## 2020-04-17 NOTE — Progress Notes (Signed)
Patient ID: Justin [name], male   DOB: 05-07-1950, 70 y.o.   MRN: [id number] Patient enrolled for [employer] to ship a 7 day zio xt long term holter monitor to his home.

## 2020-04-17 NOTE — Patient Instructions (Signed)
Medication Instructions:  The current medical regimen is effective;  continue present plan and medications.  *If you need a refill on your cardiac medications before your next appointment, please call your pharmacy*   Lab Work: TSH today  If you have labs (blood work) drawn today and your tests are completely normal, you will receive your results only by: Marland Kitchen MyChart Message (if you have MyChart) OR . A paper copy in the mail If you have any lab test that is abnormal or we need to change your treatment, we will call you to review the results.   Testing/Procedures: CALCIUM SCORE  ZIO XT- Long Term Monitor Instructions   Your physician has requested you wear your ZIO patch monitor_______days.   This is a single patch monitor.  [employer] supplies one patch monitor per enrollment.  Additional stickers are not available.   Please do not apply patch if you will be having a Nuclear Stress Test, Echocardiogram, Cardiac CT, MRI, or Chest Xray during the time frame you would be wearing the monitor. The patch cannot be worn during these tests.  You cannot remove and re-apply the ZIO XT patch monitor.   Your ZIO patch monitor will be sent USPS Priority mail from [facility] directly to your home address. The monitor may also be mailed to a PO BOX if home delivery is not available.   It may take 3-5 days to receive your monitor after you have been enrolled.   Once you have received you monitor, please review enclosed instructions.  Your monitor has already been registered assigning a specific monitor serial # to you.   Applying the monitor   Shave hair from upper left chest.   Hold abrader disc by orange tab.  Rub abrader in 40 strokes over left upper chest as indicated in your monitor instructions.   Clean area with 4 enclosed alcohol pads .  Use all pads to assure are is cleaned thoroughly.  Let dry.   Apply patch as indicated in monitor instructions.  Patch will be place under  collarbone on left side of chest with arrow pointing upward.   Rub patch adhesive wings for 2 minutes.Remove white label marked "1".  Remove white label marked "2".  Rub patch adhesive wings for 2 additional minutes.   While looking in a mirror, press and release button in center of patch.  A small green light will flash 3-4 times .  This will be your only indicator the monitor has been turned on.     Do not shower for the first 24 hours.  You may shower after the first 24 hours.   Press button if you feel a symptom. You will hear a small click.  Record Date, Time and Symptom in the Patient Log Book.   When you are ready to remove patch, follow instructions on last 2 pages of Patient Log Book.  Stick patch monitor onto last page of Patient Log Book.   Place Patient Log Book in [place] box.  Use locking tab on box and tape box closed securely.  The [employer] has [employer] on it.  Please place in mailbox as soon as possible.  Your physician should have your test results approximately 7 days after the monitor has been mailed back to [facility].   Call [place] at [phone] if you have questions regarding your ZIO XT patch monitor.  Call them immediately if you see an orange light blinking on your monitor.  If your monitor falls off in less than 4 days contact our Monitor department at [phone].  If your monitor becomes loose or falls off after 4 days call [employer] at [phone] for suggestions on securing your monitor.     Follow-Up: At [facility], you and your health needs are our priority.  As part of our continuing mission to provide you with exceptional heart care, we have created designated Provider Care Teams.  These Care Teams include your primary Cardiologist (physician) and Advanced Practice Providers (APPs -  Physician Assistants and Nurse Practitioners) who all work together to provide you with the care you need, when you need  it.  We recommend signing up for the patient portal called "MyChart".  Sign up information is provided on this After Visit Summary.  MyChart is used to connect with patients for Virtual Visits (Telemedicine).  Patients are able to view lab/test results, encounter notes, upcoming appointments, etc.  Non-urgent messages can be sent to your provider as well.   To learn more about what you can do with MyChart, go to [email or web address].    Your next appointment:   2 month(s)  The format for your next appointment:   In Person  Provider:   Eleonore Chiquito, MD

## 2020-04-18 LAB — TSH: TSH: 1.98 u[IU]/mL (ref 0.450–4.500)

## 2020-04-22 DIAGNOSIS — R002 Palpitations: Secondary | ICD-10-CM | POA: Diagnosis not present

## 2020-05-06 DIAGNOSIS — R002 Palpitations: Secondary | ICD-10-CM | POA: Diagnosis not present

## 2020-05-13 DIAGNOSIS — R002 Palpitations: Secondary | ICD-10-CM | POA: Diagnosis not present

## 2020-05-13 DIAGNOSIS — I10 Essential (primary) hypertension: Secondary | ICD-10-CM | POA: Diagnosis not present

## 2020-05-16 ENCOUNTER — Other Ambulatory Visit: Payer: Self-pay

## 2020-05-16 ENCOUNTER — Ambulatory Visit (INDEPENDENT_AMBULATORY_CARE_PROVIDER_SITE_OTHER)
Admission: RE | Admit: 2020-05-16 | Discharge: 2020-05-16 | Disposition: A | Discharge status: Home / Self Care | Payer: Self-pay | Source: Ambulatory Visit | Attending: Cardiovascular Disease | Admitting: Cardiovascular Disease

## 2020-05-16 DIAGNOSIS — R000 Tachycardia, unspecified: Secondary | ICD-10-CM

## 2020-05-16 DIAGNOSIS — I10 Essential (primary) hypertension: Secondary | ICD-10-CM

## 2020-05-16 DIAGNOSIS — R002 Palpitations: Secondary | ICD-10-CM

## 2020-05-20 DIAGNOSIS — M7542 Impingement syndrome of left shoulder: Secondary | ICD-10-CM | POA: Diagnosis not present

## 2020-05-22 DIAGNOSIS — N182 Chronic kidney disease, stage 2 (mild): Secondary | ICD-10-CM | POA: Diagnosis not present

## 2020-05-22 DIAGNOSIS — I129 Hypertensive chronic kidney disease with stage 1 through stage 4 chronic kidney disease, or unspecified chronic kidney disease: Secondary | ICD-10-CM | POA: Diagnosis not present

## 2020-05-22 NOTE — Progress Notes (Signed)
Cardiology Office Note:   Date:  05/23/2020  NAME:  Justin [name]    MRN: [id number] DOB:  [birth year]   PCP:  Redmond School, MD  Cardiologist:  No primary care provider on file.  Electrophysiologist:  None   Referring MD: Redmond School, MD   Chief Complaint  Patient presents with  . Follow-up    History of Present Illness:   KINTE TRIM is a 70 y.o. male with a hx of HTN, HLD, CAD who presents for follow-up. Seen for palpitations. Brief EAT on monitor. Elevated cac score. He presents with a blood pressure log.  Blood pressures ranging between 272 systolically up to 536.  BP values appear to be less than 130/80 on most occasions.  He reports he is having episodes of irregular heartbeat that are occurring once every 2 weeks.  Symptoms last seconds.  They are not bothersome.  We went over the results of his heart monitor which shows brief atrial tachycardia episodes.  We discussed that treatment is not needed at this time.  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  No chest pain or shortness of breath.  He is a never smoker.  He presents with his wife.  Elevated coronary calcium score.  We discussed starting Crestor 20 mg daily.  He is okay to do this.  He will need a repeat lipid profile in 2 months.  Cardiovascular examination is normal.  He denies any symptoms in the office today.  Problem list 1.  Hypertension 2.  Hyperlipidemia -Total cholesterol 204, HDL 61, LDL 87, triglycerides 145 3. Ectopic atrial tachycardia -brief ~7.6 seconds 4. CAD -CAC score 765 (82nd percentile)  Past Medical History: Past Medical History:  Diagnosis Date  . Acid reflux   . Asthma   . GERD (gastroesophageal reflux disease)   . History of kidney stones   . Hypertension   . IBS (irritable bowel syndrome)   . Kidney stones   . Sleep apnea   . Small bowel obstruction [facility])     Past Surgical History: Past Surgical History:  Procedure Laterality Date  .  ANTERIOR CRUCIATE LIGAMENT REPAIR    . APPENDECTOMY    . BACK SURGERY    . CHOLECYSTECTOMY  2003  . CHOLECYSTECTOMY    . COLONOSCOPY  June 2004   Dr. [name]: normal  . COLONOSCOPY N/A 08/31/2012   Procedure: COLONOSCOPY;  Surgeon: [name], MD;  Location[facility];  Service: Endoscopy;  Laterality: N/A;  8:30  . EXPLORATORY LAPAROTOMY     remote past  . KNEE SURGERY  1980's.   bilateral  . LUMBAR LAMINECTOMY/DECOMPRESSION MICRODISCECTOMY Bilateral 03/24/2017   Procedure: Complete Decompression Lumbar Laminectomy at L4-L5 for Spinal Stenosis, and Foraminotomy for L4 & L5 root bilaterally for foraminal stenosis.;  Surgeon: Latanya Maudlin, MD;  Location: [facility];  Service: Orthopedics;  Laterality: Bilateral;    Current Medications: Current Meds  Medication Sig  . ALPRAZolam (XANAX) 1 MG tablet Take 1 mg by mouth at bedtime as needed for sleep.   Marland Kitchen esomeprazole (NEXIUM) 40 MG capsule Take 40 mg by mouth at bedtime.  Marland Kitchen losartan (COZAAR) 50 MG tablet Take 50 mg by mouth daily.  . Probiotic Product (ALIGN PO) Take 1 capsule by mouth daily.  . RESTASIS MULTIDOSE 0.05 % ophthalmic emulsion Place 1 drop into both eyes daily.  . rosuvastatin (CRESTOR) 20 MG tablet Take 1 tablet (20 mg total) by mouth daily.  Allergies:    Patient has no known allergies.   Social History: Social History   Socioeconomic History  . Marital status: Married    Spouse name: Jana Half  . Number of children: 3  . Years of education: 70  . Highest education level: Not on file  Occupational History  . 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 Smoking status: Never Smoker  . Smokeless tobacco: Former Systems developer    Types: Secondary school teacher  . Vaping Use: Never used  Substance and Sexual Activity  . Alcohol use: No  . Drug use: No  . Sexual activity: Not on file  Other Topics Concern  . Not  on file  Social History Narrative   ** Merged History Encounter **       Consumes caffeine occas, is left handed   Social Determinants of Radio broadcast assistant Strain: Not on file  Food Insecurity: Not on file  Transportation Needs: Not on file  Physical Activity: Not on file  Stress: Not on file  Social Connections: Not on file     Family History: The patient's family history includes Brain cancer in his mother; Cancer in his sister. There is no history of Colon cancer.  ROS:   All other ROS reviewed and negative. Pertinent positives noted in the HPI.     EKGs/Labs/Other Studies Reviewed:   The following studies were personally reviewed by me today:  Zio 05/12/2020  Impression: 1. Brief ectopic atrial tachycardia (8 episodes in 7 days; longest episode 7.6 seconds).  2. Rare ectopy.  CAC Score 05/16/2020 Coronary calcium score of 765. This was 82nd percentile for age-, race-, and sex-matched controls.  Recent Labs: 04/17/2020: TSH 1.980   Recent Lipid Panel No results found for: CHOL, TRIG, HDL, CHOLHDL, VLDL, LDLCALC, LDLDIRECT  Physical Exam:   VS:  BP (!) 162/82   Pulse 65   Ht 6' (1.829 m)   Wt 198 lb 12.8 oz (90.2 kg)   SpO2 98%   BMI 26.96 kg/m    Wt Readings from Last 3 Encounters:  05/23/20 198 lb 12.8 oz (90.2 kg)  04/17/20 210 lb 12.8 oz (95.6 kg)  08/08/17 214 lb (97.1 kg)    General: Well nourished, well developed, in no acute distress Head: Atraumatic, normal size  Eyes: PEERLA, EOMI  Neck: Supple, no JVD Endocrine: No thryomegaly Cardiac: Normal S1, S2; RRR; no murmurs, rubs, or gallops Lungs: Clear to auscultation bilaterally, no wheezing, rhonchi or rales  Abd: Soft, nontender, no hepatomegaly  Ext: No edema, pulses 2+ Musculoskeletal: No deformities, BUE and BLE strength normal and equal Skin: Warm and dry, no rashes   Neuro: Alert and oriented to person, place, time, and situation, CNII-XII grossly intact, no focal deficits   Psych: Normal mood and affect   ASSESSMENT:   [name] is a 70 y.o. male who presents for the following: 1. Atrial tachycardia ([place])   2. Coronary artery disease involving native coronary artery of native heart without angina pectoris   3. Agatston coronary artery calcium score greater than 400   4. Mixed hyperlipidemia   5. Primary hypertension     PLAN:   1. Atrial tachycardia (HCC) -Brief atrial tachycardia episodes detected on his monitor.  Normal TSH.  No atrial fibrillation detected.  Symptoms are occurring once per 2 weeks.  Last seconds.  I have recommended no treatment at this time.  We will continue to monitor.  He does need an echocardiogram just to make sure his heart is structurally normal.  He has no symptoms concerning for angina.  2. Coronary artery disease involving native coronary artery of native heart without angina pectoris 3. Agatston coronary artery calcium score greater than 400 4. Mixed hyperlipidemia -Calcium score in the 82nd percentile.  I have recommended aspirin.  He would like to take vazolore due to GI issues.  He will take 81 mg daily of this.  We will start him on Crestor 20 mg daily.  He will repeat a lipid profile in 2 months.  Goal LDL cholesterol less than 70.  5. Primary hypertension -Blood pressure elevated today however well controlled on log.  We will continue his losartan 50 mg daily.  He will continue to work on salt reduction strategies.  Disposition: Return in about 6 months (around 11/22/2020).  Medication Adjustments/Labs and Tests Ordered: Current medicines are reviewed at length with the patient today.  Concerns regarding medicines are outlined above.  Orders Placed This Encounter  Procedures  . Lipid panel  . ECHOCARDIOGRAM COMPLETE   Meds ordered this encounter  Medications  . rosuvastatin (CRESTOR) 20 MG tablet    Sig: Take 1 tablet (20 mg total) by mouth daily.    Dispense:  90 tablet    Refill:  3    Patient  Instructions  Medication Instructions:  Start Crestor 20 mg daily   *If you need a refill on your cardiac medications before your next appointment, please call your pharmacy*   Lab Work: LIPID (come back fasting in 2 months, no lab appointment needed)  If you have labs (blood work) drawn today and your tests are completely normal, you will receive your results only by: Marland Kitchen MyChart Message (if you have MyChart) OR . A paper copy in the mail If you have any lab test that is abnormal or we need to change your treatment, we will call you to review the results.   Testing/Procedures: Echocardiogram - Your physician has requested that you have an echocardiogram. Echocardiography is a painless test that uses sound waves to create images of your heart. It provides your doctor with information about the size and shape of your heart and how well your heart's chambers and valves are working. This procedure takes approximately one hour. There are no restrictions for this procedure. This will be performed at our [facility] location - [address].    Follow-Up: At [facility], you and your health needs are our priority.  As part of our continuing mission to provide you with exceptional heart care, we have created designated Provider Care Teams.  These Care Teams include your primary Cardiologist (physician) and Advanced Practice Providers (APPs -  Physician Assistants and Nurse Practitioners) who all work together to provide you with the care you need, when you need it.  We recommend signing up for the patient portal called "MyChart".  Sign up information is provided on this After Visit Summary.  MyChart is used to connect with patients for Virtual Visits (Telemedicine).  Patients are able to view lab/test results, encounter notes, upcoming appointments, etc.  Non-urgent messages can be sent to your provider as well.   To learn more about what you can do with MyChart, go to  [email or web address].    Your next appointment:   6 month(s)  The format for your next appointment:  In Person  Provider:   Eleonore Chiquito, MD        Time Spent with Patient: I have spent a total of 25 minutes with patient reviewing hospital notes, telemetry, EKGs, labs and examining the patient as well as establishing an assessment and plan that was discussed with the patient.  > 50% of time was spent in direct patient care.  Signed, Addison Naegeli. Audie Box, MD, [place]  [address] [place], [place] [address] [phone]  05/23/2020 10:30 AM

## 2020-05-23 ENCOUNTER — Other Ambulatory Visit: Payer: Self-pay

## 2020-05-23 ENCOUNTER — Ambulatory Visit (INDEPENDENT_AMBULATORY_CARE_PROVIDER_SITE_OTHER): Payer: Medicare Other | Admitting: Cardiovascular Disease

## 2020-05-23 ENCOUNTER — Encounter: Payer: Self-pay | Admitting: Cardiovascular Disease

## 2020-05-23 VITALS — BP 162/82 | HR 65 | Ht 72.0 in | Wt 198.8 lb

## 2020-05-23 DIAGNOSIS — I471 Supraventricular tachycardia: Secondary | ICD-10-CM

## 2020-05-23 DIAGNOSIS — I2510 Atherosclerotic heart disease of native coronary artery without angina pectoris: Secondary | ICD-10-CM

## 2020-05-23 DIAGNOSIS — E782 Mixed hyperlipidemia: Secondary | ICD-10-CM | POA: Diagnosis not present

## 2020-05-23 DIAGNOSIS — R931 Abnormal findings on diagnostic imaging of heart and coronary circulation: Secondary | ICD-10-CM | POA: Diagnosis not present

## 2020-05-23 DIAGNOSIS — I10 Essential (primary) hypertension: Secondary | ICD-10-CM | POA: Diagnosis not present

## 2020-05-23 MED ORDER — ROSUVASTATIN CALCIUM 20 MG PO TABS: 20.0000 mg | ORAL_TABLET | Freq: Every day | ORAL | 3 refills | Status: DC

## 2020-05-23 NOTE — Patient Instructions (Signed)
Medication Instructions:  Start Crestor 20 mg daily   *If you need a refill on your cardiac medications before your next appointment, please call your pharmacy*   Lab Work: LIPID (come back fasting in 2 months, no lab appointment needed)  If you have labs (blood work) drawn today and your tests are completely normal, you will receive your results only by: Marland Kitchen MyChart Message (if you have MyChart) OR . A paper copy in the mail If you have any lab test that is abnormal or we need to change your treatment, we will call you to review the results.   Testing/Procedures: Echocardiogram - Your physician has requested that you have an echocardiogram. Echocardiography is a painless test that uses sound waves to create images of your heart. It provides your doctor with information about the size and shape of your heart and how well your heart's chambers and valves are working. This procedure takes approximately one hour. There are no restrictions for this procedure. This will be performed at our [facility] location - [address], [address].    Follow-Up: At [facility], you and your health needs are our priority.  As part of our continuing mission to provide you with exceptional heart care, we have created designated Provider Care Teams.  These Care Teams include your primary Cardiologist (physician) and Advanced Practice Providers (APPs -  Physician Assistants and Nurse Practitioners) who all work together to provide you with the care you need, when you need it.  We recommend signing up for the patient portal called "MyChart".  Sign up information is provided on this After Visit Summary.  MyChart is used to connect with patients for Virtual Visits (Telemedicine).  Patients are able to view lab/test results, encounter notes, upcoming appointments, etc.  Non-urgent messages can be sent to your provider as well.   To learn more about what you can do with MyChart, go to [email or web address].     Your next appointment:   6 month(s)  The format for your next appointment:   In Person  Provider:   Eleonore Chiquito, MD

## 2020-06-13 ENCOUNTER — Ambulatory Visit (HOSPITAL_COMMUNITY)
Admission: RE | Admit: 2020-06-13 | Discharge: 2020-06-13 | Disposition: A | Discharge status: Home / Self Care | Payer: Medicare Other | Source: Ambulatory Visit | Attending: Cardiovascular Disease | Admitting: Cardiovascular Disease

## 2020-06-13 ENCOUNTER — Other Ambulatory Visit: Payer: Self-pay

## 2020-06-13 DIAGNOSIS — I471 Supraventricular tachycardia: Secondary | ICD-10-CM | POA: Insufficient documentation

## 2020-06-13 LAB — ECHOCARDIOGRAM COMPLETE
AR max vel: 2.86 cm2
AV Area VTI: 3.07 cm2
AV Area mean vel: 2.77 cm2
AV Mean grad: 2.8 mmHg
AV Peak grad: 5.9 mmHg
Ao pk vel: 1.22 m/s
Area-P 1/2: 2.44 cm2
S' Lateral: 2.5 cm

## 2020-06-13 NOTE — Progress Notes (Signed)
*  PRELIMINARY RESULTS* Echocardiogram 2D Echocardiogram has been performed.  Justin [name] 06/13/2020, 9:23 AM

## 2020-06-21 ENCOUNTER — Ambulatory Visit: Payer: Medicare Other | Admitting: Cardiovascular Disease

## 2020-06-28 DIAGNOSIS — N401 Enlarged prostate with lower urinary tract symptoms: Secondary | ICD-10-CM | POA: Diagnosis not present

## 2020-07-05 DIAGNOSIS — R351 Nocturia: Secondary | ICD-10-CM | POA: Diagnosis not present

## 2020-07-05 DIAGNOSIS — N200 Calculus of kidney: Secondary | ICD-10-CM | POA: Diagnosis not present

## 2020-07-05 DIAGNOSIS — N401 Enlarged prostate with lower urinary tract symptoms: Secondary | ICD-10-CM | POA: Diagnosis not present

## 2020-07-10 DIAGNOSIS — I10 Essential (primary) hypertension: Secondary | ICD-10-CM | POA: Diagnosis not present

## 2020-07-10 DIAGNOSIS — K660 Peritoneal adhesions (postprocedural) (postinfection): Secondary | ICD-10-CM | POA: Diagnosis not present

## 2020-07-10 DIAGNOSIS — Z6828 Body mass index (BMI) 28.0-28.9, adult: Secondary | ICD-10-CM | POA: Diagnosis not present

## 2020-07-10 DIAGNOSIS — Z1331 Encounter for screening for depression: Secondary | ICD-10-CM | POA: Diagnosis not present

## 2020-07-10 DIAGNOSIS — E663 Overweight: Secondary | ICD-10-CM | POA: Diagnosis not present

## 2020-07-10 DIAGNOSIS — M7022 Olecranon bursitis, left elbow: Secondary | ICD-10-CM | POA: Diagnosis not present

## 2020-07-10 DIAGNOSIS — I700 Atherosclerosis of aorta: Secondary | ICD-10-CM | POA: Diagnosis not present

## 2020-07-10 DIAGNOSIS — N400 Enlarged prostate without lower urinary tract symptoms: Secondary | ICD-10-CM | POA: Diagnosis not present

## 2020-07-23 DIAGNOSIS — I129 Hypertensive chronic kidney disease with stage 1 through stage 4 chronic kidney disease, or unspecified chronic kidney disease: Secondary | ICD-10-CM | POA: Diagnosis not present

## 2020-07-23 DIAGNOSIS — N182 Chronic kidney disease, stage 2 (mild): Secondary | ICD-10-CM | POA: Diagnosis not present

## 2020-09-19 DIAGNOSIS — H04123 Dry eye syndrome of bilateral lacrimal glands: Secondary | ICD-10-CM | POA: Diagnosis not present

## 2020-10-24 DIAGNOSIS — M75101 Unspecified rotator cuff tear or rupture of right shoulder, not specified as traumatic: Secondary | ICD-10-CM | POA: Diagnosis not present

## 2020-10-24 DIAGNOSIS — M7521 Bicipital tendinitis, right shoulder: Secondary | ICD-10-CM | POA: Diagnosis not present

## 2020-10-24 DIAGNOSIS — M7541 Impingement syndrome of right shoulder: Secondary | ICD-10-CM | POA: Diagnosis not present

## 2020-11-20 NOTE — Progress Notes (Signed)
Cardiology Office Note:   Date:  11/21/2020  NAME:  Justin [name]    MRN: [id number] DOB:  [birth year]   PCP:  Redmond School, MD  Cardiologist:  None  Electrophysiologist:  None   Referring MD: Redmond School, MD   Chief Complaint  Patient presents with   Follow-up   History of Present Illness:   Justin [name] is a 70 y.o. male with a hx of elevated coronary calcium score, HTN, HLD, atrial tachycardia who presents for follow-up.  He reports he is doing well.  Only had 1 episode of palpitations in the last 6 months.  Denies any chest pain or trouble breathing.  He can walk 2 miles per day without limitations.  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  No significant limitations.  He has remained on Crestor.  Has plans to repeat lab work/primary care physician next week.  I would like him to forward me the results.  Overall doing quite well.  Denies any symptoms of angina.  Problem List 1.  Hypertension 2.  Hyperlipidemia -Total cholesterol 204, HDL 61, LDL 87, triglycerides 145 3. Ectopic atrial tachycardia -brief ~7.6 seconds 4. CAD -CAC score 765 (82nd percentile)  Past Medical History: Past Medical History:  Diagnosis Date   Acid reflux    Asthma    GERD (gastroesophageal reflux disease)    History of kidney stones    Hypertension    IBS (irritable bowel syndrome)    Kidney stones    Sleep apnea    Small bowel obstruction [facility])     Past Surgical History: Past Surgical History:  Procedure Laterality Date   ANTERIOR CRUCIATE LIGAMENT REPAIR     APPENDECTOMY     BACK SURGERY     CHOLECYSTECTOMY  2003   CHOLECYSTECTOMY     COLONOSCOPY  June 2004   Dr. [name]: normal   COLONOSCOPY N/A 08/31/2012   Procedure: COLONOSCOPY;  Surgeon: [name], MD;  [facility];  Service: Endoscopy;  Laterality: N/A;  8:30   EXPLORATORY LAPAROTOMY     remote past   KNEE SURGERY  1980's.   bilateral   LUMBAR LAMINECTOMY/DECOMPRESSION MICRODISCECTOMY  Bilateral 03/24/2017   Procedure: Complete Decompression Lumbar Laminectomy at L4-L5 for Spinal Stenosis, and Foraminotomy for L4 & L5 root bilaterally for foraminal stenosis.;  Surgeon: Latanya Maudlin, MD;  Location: [facility];  Service: Orthopedics;  Laterality: Bilateral;    Current Medications: Current Meds  Medication Sig   ALPRAZolam (XANAX) 1 MG tablet Take 1 mg by mouth at bedtime as needed for sleep.    esomeprazole (NEXIUM) 40 MG capsule Take 40 mg by mouth at bedtime.   losartan (COZAAR) 50 MG tablet Take 50 mg by mouth 2 (two) times daily.   Probiotic Product (ALIGN PO) Take 1 capsule by mouth daily.   RESTASIS MULTIDOSE 0.05 % ophthalmic emulsion Place 1 drop into both eyes daily.   [DISCONTINUED] metoprolol tartrate (LOPRESSOR) 50 MG tablet Take 1 tablet by mouth once for procedure.     Allergies:    Patient has no known allergies.   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 [place]  Tobacco Use   Smoking status: Never   Smokeless tobacco: Former    Types: Chew    Quit date: 2013  Vaping Use   Vaping Use: Never used  Substance and Sexual Activity   Alcohol use: No   Drug use: No   Sexual activity: Not on file  Other Topics Concern   Not on file  Social History Narrative   ** Merged History Encounter **       Consumes caffeine occas, is left handed   Social Determinants of Radio broadcast assistant Strain: Not on file  Food Insecurity: Not on file  Transportation Needs: Not on file  Physical Activity: Not on file  Stress: Not on file  Social Connections: Not on file     Family History: The patient's family history includes Brain cancer in his  mother; Cancer in his sister. There is no history of Colon cancer.  ROS:   All other ROS reviewed and negative. Pertinent positives noted in the HPI.     EKGs/Labs/Other Studies Reviewed:   The following studies were personally reviewed by me today:  Zio 04/17/2020 Impression: 1. Brief ectopic atrial tachycardia (8 episodes in 7 days; longest episode 7.6 seconds).  2. Rare ectopy.  TTE 06/13/2020  1. Left ventricular ejection fraction, by estimation, is 55 to 60%. The  left ventricle has normal function. The left ventricle has no regional  wall motion abnormalities. Left ventricular diastolic parameters are  consistent with Grade I diastolic  dysfunction (impaired relaxation).   2. Right ventricular systolic function is normal. The right ventricular  size is normal.   3. The mitral valve is normal in structure. No evidence of mitral valve  regurgitation. No evidence of mitral stenosis.   4. The aortic valve is tricuspid. Aortic valve regurgitation is not  visualized. No aortic stenosis is present.   5. Aortic dilatation noted. There is mild dilatation of the aortic root,  measuring 40 mm. There is mild dilatation of the ascending aorta,  measuring 38 mm.   6. The inferior vena cava is normal in size with greater than 50%  respiratory variability, suggesting right atrial pressure of 3 mmHg.   Recent Labs: 04/17/2020: TSH 1.980   Recent Lipid Panel No results found for: CHOL, TRIG, HDL, CHOLHDL, VLDL, LDLCALC, LDLDIRECT  Physical Exam:   VS:  BP (!) 145/82   Pulse 62   Ht 6' (1.829 m)   Wt 199 lb 6.4 oz (90.4 kg)   SpO2 99%   BMI 27.04 kg/m    Wt Readings from Last 3 Encounters:  11/21/20 199 lb 6.4 oz (90.4 kg)  05/23/20 198 lb 12.8 oz (90.2 kg)  04/17/20 210 lb 12.8 oz (95.6 kg)    General: Well nourished, well developed, in no acute distress Head: Atraumatic, normal size  Eyes: PEERLA, EOMI  Neck: Supple, no JVD Endocrine: No thryomegaly Cardiac: Normal S1, S2;  RRR; no murmurs, rubs, or gallops Lungs: Clear to auscultation bilaterally, no wheezing, rhonchi or rales  Abd: Soft, nontender, no hepatomegaly  Ext: No edema, pulses 2+ Musculoskeletal: No deformities, BUE and BLE strength normal and equal Skin: Warm and dry, no rashes   Neuro: Alert and oriented to person, place, time, and situation, CNII-XII grossly intact, no focal deficits  Psych: Normal mood and affect   ASSESSMENT:   Justin [name] is a 70 y.o. male who presents for the following: 1. Atrial tachycardia ([place])   2. Coronary artery disease  involving native coronary artery of native heart without angina pectoris   3. Agatston coronary artery calcium score greater than 400   4. Mixed hyperlipidemia     PLAN:   1. Atrial tachycardia (HCC) -Episodes of palpitations.  Infrequent atrial tachycardia less than 8 seconds were captured on monitor.  He really has continued to have no symptoms with stress reductive strategies.  We will plan to hold off on treatment for now.  Echo was normal.  No major symptoms that are concerning.  2. Coronary artery disease involving native coronary artery of native heart without angina pectoris 3. Agatston coronary artery calcium score greater than 400 4. Mixed hyperlipidemia -Elevated calcium score. CAC score 765 (82nd percentile).  No symptoms of angina.  Blood pressure well controlled.  He will continue aspirin 81 mg daily.  Continue Crestor 20 mg daily.  He will have lipids checked by his primary care physician next week.  He will forward me the results.  Goal LDL less than 70.     Disposition: Return in about 1 year (around 11/21/2021).  Medication Adjustments/Labs and Tests Ordered: Current medicines are reviewed at length with the patient today.  Concerns regarding medicines are outlined above.  No orders of the defined types were placed in this encounter.  Meds ordered this encounter  Medications   DISCONTD: metoprolol tartrate (LOPRESSOR) 50  MG tablet    Sig: Take 1 tablet by mouth once for procedure.    Dispense:  1 tablet    Refill:  0   rosuvastatin (CRESTOR) 20 MG tablet    Sig: Take 1 tablet (20 mg total) by mouth daily.    Dispense:  90 tablet    Refill:  3    Patient Instructions  Medication Instructions:  The current medical regimen is effective;  continue present plan and medications.  *If you need a refill on your cardiac medications before your next appointment, please call your pharmacy*  Follow-Up: At [facility], you and your health needs are our priority.  As part of our continuing mission to provide you with exceptional heart care, we have created designated Provider Care Teams.  These Care Teams include your primary Cardiologist (physician) and Advanced Practice Providers (APPs -  Physician Assistants and Nurse Practitioners) who all work together to provide you with the care you need, when you need it.  We recommend signing up for the patient portal called "MyChart".  Sign up information is provided on this After Visit Summary.  MyChart is used to connect with patients for Virtual Visits (Telemedicine).  Patients are able to view lab/test results, encounter notes, upcoming appointments, etc.  Non-urgent messages can be sent to your provider as well.   To learn more about what you can do with MyChart, go to [email or web address].    Your next appointment:   12 month(s)  The format for your next appointment:   In Person  Provider:   Eleonore Chiquito, MD       Time Spent with Patient: I have spent a total of 35 minutes with patient reviewing hospital notes, telemetry, EKGs, labs and examining the patient as well as establishing an assessment and plan that was discussed with the patient.  > 50% of time was spent in direct patient care.  Signed, Addison Naegeli. Audie Box, MD, [place]  [address], [place], [place] [address] [phone]  11/21/2020 11:00 AM

## 2020-11-21 ENCOUNTER — Encounter: Payer: Self-pay | Admitting: Cardiovascular Disease

## 2020-11-21 ENCOUNTER — Other Ambulatory Visit: Payer: Self-pay

## 2020-11-21 ENCOUNTER — Ambulatory Visit (INDEPENDENT_AMBULATORY_CARE_PROVIDER_SITE_OTHER): Payer: Medicare Other | Admitting: Cardiovascular Disease

## 2020-11-21 VITALS — BP 145/82 | HR 62 | Ht 72.0 in | Wt 199.4 lb

## 2020-11-21 DIAGNOSIS — R931 Abnormal findings on diagnostic imaging of heart and coronary circulation: Secondary | ICD-10-CM

## 2020-11-21 DIAGNOSIS — I471 Supraventricular tachycardia: Secondary | ICD-10-CM | POA: Diagnosis not present

## 2020-11-21 DIAGNOSIS — E782 Mixed hyperlipidemia: Secondary | ICD-10-CM | POA: Diagnosis not present

## 2020-11-21 DIAGNOSIS — I2510 Atherosclerotic heart disease of native coronary artery without angina pectoris: Secondary | ICD-10-CM

## 2020-11-21 MED ORDER — METOPROLOL TARTRATE 50 MG PO TABS: ORAL_TABLET | ORAL | 0 refills | Status: DC

## 2020-11-21 MED ORDER — ROSUVASTATIN CALCIUM 20 MG PO TABS: 20.0000 mg | ORAL_TABLET | Freq: Every day | ORAL | 3 refills | Status: DC

## 2020-11-21 NOTE — Patient Instructions (Signed)

## 2020-11-22 DIAGNOSIS — M7542 Impingement syndrome of left shoulder: Secondary | ICD-10-CM | POA: Diagnosis not present

## 2020-12-06 DIAGNOSIS — M25512 Pain in left shoulder: Secondary | ICD-10-CM | POA: Diagnosis not present

## 2020-12-06 DIAGNOSIS — M7542 Impingement syndrome of left shoulder: Secondary | ICD-10-CM | POA: Diagnosis not present

## 2020-12-13 DIAGNOSIS — E663 Overweight: Secondary | ICD-10-CM | POA: Diagnosis not present

## 2020-12-13 DIAGNOSIS — M1991 Primary osteoarthritis, unspecified site: Secondary | ICD-10-CM | POA: Diagnosis not present

## 2020-12-13 DIAGNOSIS — N400 Enlarged prostate without lower urinary tract symptoms: Secondary | ICD-10-CM | POA: Diagnosis not present

## 2020-12-13 DIAGNOSIS — Z0000 Encounter for general adult medical examination without abnormal findings: Secondary | ICD-10-CM | POA: Diagnosis not present

## 2020-12-13 DIAGNOSIS — I10 Essential (primary) hypertension: Secondary | ICD-10-CM | POA: Diagnosis not present

## 2020-12-13 DIAGNOSIS — Z23 Encounter for immunization: Secondary | ICD-10-CM | POA: Diagnosis not present

## 2020-12-13 DIAGNOSIS — Z1331 Encounter for screening for depression: Secondary | ICD-10-CM | POA: Diagnosis not present

## 2020-12-13 DIAGNOSIS — Z9229 Personal history of other drug therapy: Secondary | ICD-10-CM | POA: Diagnosis not present

## 2020-12-13 DIAGNOSIS — N182 Chronic kidney disease, stage 2 (mild): Secondary | ICD-10-CM | POA: Diagnosis not present

## 2020-12-13 DIAGNOSIS — Z6827 Body mass index (BMI) 27.0-27.9, adult: Secondary | ICD-10-CM | POA: Diagnosis not present

## 2020-12-23 DIAGNOSIS — M75102 Unspecified rotator cuff tear or rupture of left shoulder, not specified as traumatic: Secondary | ICD-10-CM | POA: Diagnosis not present

## 2021-02-19 DIAGNOSIS — N401 Enlarged prostate with lower urinary tract symptoms: Secondary | ICD-10-CM | POA: Diagnosis not present

## 2021-02-26 DIAGNOSIS — N5201 Erectile dysfunction due to arterial insufficiency: Secondary | ICD-10-CM | POA: Diagnosis not present

## 2021-02-26 DIAGNOSIS — R350 Frequency of micturition: Secondary | ICD-10-CM | POA: Diagnosis not present

## 2021-02-26 DIAGNOSIS — N401 Enlarged prostate with lower urinary tract symptoms: Secondary | ICD-10-CM | POA: Diagnosis not present

## 2021-02-26 DIAGNOSIS — N200 Calculus of kidney: Secondary | ICD-10-CM | POA: Diagnosis not present

## 2021-04-18 DIAGNOSIS — U071 COVID-19: Secondary | ICD-10-CM | POA: Diagnosis not present

## 2021-04-18 DIAGNOSIS — I10 Essential (primary) hypertension: Secondary | ICD-10-CM | POA: Diagnosis not present

## 2021-06-30 ENCOUNTER — Other Ambulatory Visit: Payer: Self-pay | Admitting: Urology

## 2021-06-30 DIAGNOSIS — N401 Enlarged prostate with lower urinary tract symptoms: Secondary | ICD-10-CM | POA: Diagnosis not present

## 2021-06-30 DIAGNOSIS — R351 Nocturia: Secondary | ICD-10-CM | POA: Diagnosis not present

## 2021-06-30 DIAGNOSIS — N200 Calculus of kidney: Secondary | ICD-10-CM | POA: Diagnosis not present

## 2021-07-01 ENCOUNTER — Encounter (HOSPITAL_BASED_OUTPATIENT_CLINIC_OR_DEPARTMENT_OTHER): Payer: Self-pay | Admitting: Urology

## 2021-07-01 NOTE — H&P (Signed)
? ?Office Visit Report     06/30/2021  ? ?-------------------------------------------------------------------------------- ?  ?[name]  ?MRN: [id number]  ?DOB: 1950/11/17, 71 year old Male  ?SSN: [id number]  ? PRIMARY CARE:  Redmond School, MD  ?REFERRING:  Sharilyn Sites, MD  ?PROVIDER:  Festus Aloe, M.D.  ?LOCATION:  [facility] [phone]  ?  ? ?-------------------------------------------------------------------------------- ?  ?CC/HPI: F/u -  ? ? ?1) kidney stone - stones are Calcium oxalate. KUB Jul 2018 with 5 mm RUP and 8 mm RLP. He passed the RUP stone (CT Oct 2018 - left clear, right 8 mm LP stone remaining. There was a 4 mm stone at right UVJ. He passed this stone (visible on scout and not seen on f/u KUB).  ? ?April 2020 CT a/p and KUB revealed a stable 8 mm right LP stone. No other stones were noted. KUB, 11/21, measured about 9 mm.  ? ?KUB 01/23 stable at about 10 mm.  ? ? ?2) ED - approximately 05/24/2013. His symptoms did begin gradually. His symptoms have been worse over the last year. He does have difficulties achieving an erection. He does have problems maintaining his erections. His erections are straight. He has tried Cialis. It did not work.  ? ?He has a good libido. He tried Cialis. No curvature but noticed end of penis not as hard. He takes sildenafil 50 mg. He notes better urine flow better after pde5i.  ? ? ?3) BPH - PSA was 2.6 in 2019. PSA was 2.2 with PCP in 2020. He has occasional urgency. He started daily tadalafil and is doing well. Less nocturia. Stream is not great but he doesn't strain and seems to empty. Some PV dribble.  ? ?PSA up to 3.8 10/21. PSA back down to 2.26. His May 2022 PSA was 2.3 and 12/22 2.7. He has times when he had frequency and urgency with coffee. He started tamsulosin Jan 2023.  ? ? ?Today, Justin [name] is seen for the above. Voiding well. No dysuria or gross hematuria. No flank pain or stone passage. Only on 81 mg ASA daily. May 2023 KUB with  12 mm RLP stone. Voiding well on tamsulosin. AUASS =1.  ? ?  ?ALLERGIES: No Allergies ?  ? ?MEDICATIONS: Nexium  ?Tadalafil 5 mg tablet 1 tablet PO Daily  ?Tamsulosin Hcl 0.4 mg capsule  ?Alprazolam 1 mg tablet Oral PRN  ?Aspirin Ec 81 mg tablet, delayed release  ?Losartan Potassium 100 mg tablet  ?Rosuvastatin Calcium 20 mg tablet  ?  ? ?GU PSH: Vasectomy - 2010 ? ?  ?   ?[place] Notes: Intestinal Surgery, Liver Biopsy, Surgery Of Male Genitalia Vasectomy, Gallbladder Surgery, Knee Surgery  ? ?NON-GU PSH: Back surgery - 2019 ?Needle Biopsy Liver - 2010 ?Unlisted Procedure Intestine - 2010 ? ?  ? ?GU PMH: BPH w/LUTS, on daily tadalafil and will add a tamsulosin capsule which he can take as needed - 02/26/2021, On daily tadlafil - PSA low , - 07/05/2020, tadlafil refilled. Check PSA in 6 mo. , - 12/25/2019, - 2019 ?ED due to arterial insufficiency - 02/26/2021, - 12/25/2019, - 2020, - 2019, - 2019, - 2018 ?Renal calculus, Stone about 1 mm larger per year. Disc I discussed with the patient the nature risks and benefits of continued stone passage, off label use of alpha blockers, shockwave lithotripsy or ureteroscopy. All questions answered. He wants to consider ESWL when it's warm. He never wants a "stent" again. - 02/26/2021, Check KUB in 6 mo , -  07/05/2020, No change in stone. , - 12/25/2019, - 2020, - 2019, - 2019, - 2019, - 2018, Bilateral kidney stones, - 2016 ?Urinary Frequency - 02/26/2021 ?Nocturia - 07/05/2020, - 2020 ?Urinary Urgency - 12/25/2019, - 2019 ?Renal and ureteral calculus - 2018 ?BPH w/o LUTS - 2018, Benign localized prostatic hyperplasia without lower urinary tract symptoms (LUTS), - 2015 ?Peyronies Disease, Peyronie's disease - 2015 ?Bladder-neck stenosis/contracture, Bladder neck contracture - 2014 ?Epididymitis, Epididymitis - 2014 ?History of urolithiasis, Nephrolithiasis - 2014 ?Hydrocele, Unspec, Hydrocele, bilateral - 2014 ?Renal cyst, Renal cyst, acquired - 2014 ?  ?   ?PMH Notes:  ?2008-04-30 09:05:34 -  Note: Gout  ?2012-02-11 12:28:12 - Note: Arthritis  ? ?NON-GU PMH: Encounter for general adult medical examination without abnormal findings, Encounter for preventive health examination - 2016 ?Anxiety, Anxiety (Symptom) - 2014 ?Irritable bowel syndrome with diarrhea, Irritable Bowel Syndrome - 2014 ?Personal history of other diseases of the circulatory system, History of hypertension - 2014 ?Personal history of other diseases of the digestive system, History of esophageal reflux - 2014, History of intestinal obstruction, - 2014 ?Personal history of other specified conditions, History of heartburn - 2014 ?  ? ?FAMILY HISTORY: Breast Cancer - Sister ?Death In The Family Father - Father ?Death In The Family Mother - Mother ?Family Health Status Number - Runs In Family ?Prostate Cancer - Runs In Family  ? ?SOCIAL HISTORY: Marital Status: Married ?Preferred Language: [place]; Ethnicity: Not Hispanic Or Latino; Race: White ?Current Smoking Status: Patient has never smoked.  ? ?Tobacco Use Assessment Completed: Used Tobacco in last 30 days? ?  ?  Notes: Never A Smoker, Caffeine Use, Marital History - Currently Married, Occupation:, Tobacco Use, Alcohol Use  ? ?REVIEW OF SYSTEMS:    ?GU Review Male:   Patient reports get up at night to urinate. Patient denies frequent urination, hard to postpone urination, burning/ pain with urination, leakage of urine, stream starts and stops, trouble starting your stream, have to strain to urinate , erection problems, and penile pain.  ?Gastrointestinal (Upper):   Patient denies nausea, vomiting, and indigestion/ heartburn.  ?Gastrointestinal (Lower):   Patient denies diarrhea and constipation.  ?Constitutional:   Patient denies fever, night sweats, weight loss, and fatigue.  ?Skin:   Patient denies skin rash/ lesion and itching.  ?Eyes:   Patient denies blurred vision and double vision.  ?Ears/ Nose/ Throat:   Patient denies sore throat and sinus problems.  ?Hematologic/Lymphatic:    Patient denies swollen glands and easy bruising.  ?Cardiovascular:   Patient denies leg swelling and chest pains.  ?Respiratory:   Patient denies cough and shortness of breath.  ?Endocrine:   Patient denies excessive thirst.  ?Musculoskeletal:   Patient denies back pain and joint pain.  ?Neurological:   Patient denies headaches and dizziness.  ?Psychologic:   Patient denies depression and anxiety.  ? ?VITAL SIGNS: None  ? ?MULTI-SYSTEM PHYSICAL EXAMINATION:    ?Constitutional: Well-nourished. No physical deformities. Normally developed. Good grooming.  ?Neck: Neck symmetrical, not swollen. Normal tracheal position.  ?Respiratory: No labored breathing, no use of accessory muscles.   ?Cardiovascular: Normal temperature, normal extremity pulses, no swelling, no varicosities.  ?Skin: No paleness, no jaundice, no cyanosis. No lesion, no ulcer, no rash.  ?Neurologic / Psychiatric: Oriented to time, oriented to place, oriented to person. No depression, no anxiety, no agitation.  ?Gastrointestinal: No mass, no tenderness, no rigidity, non obese abdomen.  ? ?  ?Complexity of Data:  ?X-Ray Review: KUB: Reviewed Films. Discussed With Patient.  2021, 2020  ?C.T. Abdomen/Pelvis: Reviewed Films. 2020 ?  ? 02/19/21 06/28/20 12/07/17 09/22/16 09/20/12  ?PSA  ?Total PSA 2.77 ng/mL 2.26 ng/mL 2.60 ng/mL 2.15 ng/mL 1.66   ? ? ?PROCEDURES:    ?     KUB - 40768  ?A single view of the abdomen is obtained.  ?Calculi:  12 mm RLP stone   ?  ?  ?The bones appeared normal. The bowel gas pattern appeared normal. The soft tissues were unremarkable. ?. Patient confirmed No Neulasta OnPro Device.  ?  ? ? ?     Urinalysis ?Dipstick Dipstick Cont'd  ?Color: Straw Bilirubin: Neg mg/dL  ?Appearance: Clear Ketones: Neg mg/dL  ?Specific Gravity: 1.015 Blood: Neg ery/uL  ?pH: 5.5 Protein: Neg mg/dL  ?Glucose: Neg mg/dL Urobilinogen: 0.2 mg/dL  ?  Nitrites: Neg  ?  Leukocyte Esterase: Neg leu/uL  ? ? ?ASSESSMENT:  ?    ICD-10 Details  ?1 GU:   BPH w/LUTS  - N40.1 Chronic, Stable - cont tams   ?2   Nocturia - R35.1 Chronic, Stable  ?3   Renal calculus - N20.0 Chronic, Stable - We went over the nature r/b/a to right ESWL. He will stop the low dose ASA. Tylen

## 2021-07-01 NOTE — Progress Notes (Signed)
Patient phone call complete. Patient procedure date and time confirmed. Patient medications and allergies verified. Patient denies recent history of chest pain or COVID.  Patient denies any tobacco use. Patient advised not to have any Pepto Bismol or NSAIDs. Patient advised not to have Aspirin 72 hours prior. Patient told to take laxative of choice on Wednesday. Patient to have clear liquids 0600 and solids either until 0400 or to be NPO after midnight. Patient advised not to have alcohol 24 hours prior to procedure. Patient denied history of sleep apnea and home O2 use. Patietn advised to remove all pain patches prior to arrival. Driver secured.  ?

## 2021-07-03 ENCOUNTER — Ambulatory Visit (HOSPITAL_BASED_OUTPATIENT_CLINIC_OR_DEPARTMENT_OTHER)
Admission: RE | Admit: 2021-07-03 | Discharge: 2021-07-03 | Disposition: A | Discharge status: Home / Self Care | Payer: Medicare Other | Attending: Urology | Admitting: Urology

## 2021-07-03 ENCOUNTER — Ambulatory Visit (HOSPITAL_COMMUNITY): Payer: Medicare Other

## 2021-07-03 ENCOUNTER — Encounter (HOSPITAL_BASED_OUTPATIENT_CLINIC_OR_DEPARTMENT_OTHER)
Admission: RE | Disposition: A | Discharge status: Home / Self Care | Payer: Self-pay | Source: Home / Self Care | Attending: Urology

## 2021-07-03 ENCOUNTER — Encounter (HOSPITAL_BASED_OUTPATIENT_CLINIC_OR_DEPARTMENT_OTHER): Payer: Self-pay | Admitting: Urology

## 2021-07-03 DIAGNOSIS — M47816 Spondylosis without myelopathy or radiculopathy, lumbar region: Secondary | ICD-10-CM | POA: Diagnosis not present

## 2021-07-03 DIAGNOSIS — N200 Calculus of kidney: Secondary | ICD-10-CM | POA: Insufficient documentation

## 2021-07-03 DIAGNOSIS — R351 Nocturia: Secondary | ICD-10-CM | POA: Diagnosis not present

## 2021-07-03 DIAGNOSIS — I10 Essential (primary) hypertension: Secondary | ICD-10-CM | POA: Insufficient documentation

## 2021-07-03 DIAGNOSIS — N401 Enlarged prostate with lower urinary tract symptoms: Secondary | ICD-10-CM | POA: Insufficient documentation

## 2021-07-03 DIAGNOSIS — Z01818 Encounter for other preprocedural examination: Secondary | ICD-10-CM | POA: Diagnosis not present

## 2021-07-03 HISTORY — PX: EXTRACORPOREAL SHOCK WAVE LITHOTRIPSY: SHX1557

## 2021-07-03 SURGERY — LITHOTRIPSY, ESWL
Anesthesia: LOCAL | Laterality: Right

## 2021-07-03 MED ORDER — HYDROCODONE-ACETAMINOPHEN 5-325 MG PO TABS: 1.0000 | ORAL_TABLET | ORAL | 0 refills | Status: DC | PRN

## 2021-07-03 MED ORDER — DIAZEPAM 5 MG PO TABS
ORAL_TABLET | ORAL | Status: AC
  Filled 2021-07-03: qty 2

## 2021-07-03 MED ORDER — DIAZEPAM 5 MG PO TABS
10.0000 mg | ORAL_TABLET | ORAL | Status: AC
  Administered 2021-07-03: 10 mg via ORAL

## 2021-07-03 MED ORDER — CIPROFLOXACIN HCL 500 MG PO TABS
500.0000 mg | ORAL_TABLET | ORAL | Status: AC
  Administered 2021-07-03: 500 mg via ORAL

## 2021-07-03 MED ORDER — ASPIRIN EC 81 MG PO TBEC: 81.0000 mg | DELAYED_RELEASE_TABLET | Freq: Every day | ORAL | 11 refills | Status: DC

## 2021-07-03 MED ORDER — SODIUM CHLORIDE 0.9 % IV SOLN: INTRAVENOUS | Status: DC

## 2021-07-03 MED ORDER — DIPHENHYDRAMINE HCL 25 MG PO CAPS
ORAL_CAPSULE | ORAL | Status: AC
  Filled 2021-07-03: qty 1

## 2021-07-03 MED ORDER — TAMSULOSIN HCL 0.4 MG PO CAPS: 0.4000 mg | ORAL_CAPSULE | Freq: Every day | ORAL | 0 refills | Status: DC

## 2021-07-03 MED ORDER — DIPHENHYDRAMINE HCL 25 MG PO CAPS
25.0000 mg | ORAL_CAPSULE | ORAL | Status: AC
  Administered 2021-07-03: 25 mg via ORAL

## 2021-07-03 MED ORDER — CIPROFLOXACIN HCL 500 MG PO TABS
ORAL_TABLET | ORAL | Status: AC
  Filled 2021-07-03: qty 1

## 2021-07-03 NOTE — Op Note (Signed)
Right ESWL  ? ?Right 12 mm stone  ? ?Findings: excellent energy delivery. Pt tolerated well. Stone fragmented well. He may need a staged procedure if he fails to pass the stone or stone fragments.  ? ? ?

## 2021-07-03 NOTE — Interval H&P Note (Signed)
History and Physical Interval Note: ? ?07/03/2021 ?10:05 AM ? ?[name]  has presented today for surgery, with the diagnosis of RIGHT RENAL STONE.  The various methods of treatment have been discussed with the patient and family. After consideration of risks, benefits and other options for treatment, the patient has consented to  Procedure(s): ?EXTRACORPOREAL SHOCK WAVE LITHOTRIPSY (ESWL) (Right) as a surgical intervention.  The patient's history has been reviewed, patient examined, no change in status, stable for surgery.  I have reviewed the patient's chart and labs.  Stone measured about 11 mm on KUB today and looks more medial - right UPJ. Questions were answered to the patient's satisfaction.   ? ? ?Justin [name] ? ? ?

## 2021-07-04 ENCOUNTER — Encounter (HOSPITAL_BASED_OUTPATIENT_CLINIC_OR_DEPARTMENT_OTHER): Payer: Self-pay | Admitting: Urology

## 2021-07-17 DIAGNOSIS — N200 Calculus of kidney: Secondary | ICD-10-CM | POA: Diagnosis not present

## 2021-08-19 DIAGNOSIS — N200 Calculus of kidney: Secondary | ICD-10-CM | POA: Diagnosis not present

## 2021-08-20 ENCOUNTER — Other Ambulatory Visit: Payer: Self-pay | Admitting: Urology

## 2021-08-28 ENCOUNTER — Encounter (HOSPITAL_BASED_OUTPATIENT_CLINIC_OR_DEPARTMENT_OTHER): Payer: Self-pay | Admitting: Urology

## 2021-08-28 NOTE — Progress Notes (Signed)
Pre-procedure call complete.  Patient will stop aspirin and all NSAIDS.  Jana Half his wife will be his driver and caretaker.

## 2021-09-01 ENCOUNTER — Other Ambulatory Visit: Payer: Self-pay

## 2021-09-01 ENCOUNTER — Encounter (HOSPITAL_BASED_OUTPATIENT_CLINIC_OR_DEPARTMENT_OTHER): Payer: Self-pay | Admitting: Urology

## 2021-09-01 ENCOUNTER — Encounter (HOSPITAL_BASED_OUTPATIENT_CLINIC_OR_DEPARTMENT_OTHER)
Admission: RE | Disposition: A | Discharge status: Home / Self Care | Payer: Self-pay | Source: Home / Self Care | Attending: Urology

## 2021-09-01 ENCOUNTER — Ambulatory Visit (HOSPITAL_COMMUNITY): Payer: Medicare Other

## 2021-09-01 ENCOUNTER — Ambulatory Visit (HOSPITAL_BASED_OUTPATIENT_CLINIC_OR_DEPARTMENT_OTHER)
Admission: RE | Admit: 2021-09-01 | Discharge: 2021-09-01 | Disposition: A | Discharge status: Home / Self Care | Payer: Medicare Other | Attending: Urology | Admitting: Urology

## 2021-09-01 DIAGNOSIS — I10 Essential (primary) hypertension: Secondary | ICD-10-CM | POA: Insufficient documentation

## 2021-09-01 DIAGNOSIS — N200 Calculus of kidney: Secondary | ICD-10-CM | POA: Diagnosis not present

## 2021-09-01 DIAGNOSIS — N201 Calculus of ureter: Secondary | ICD-10-CM | POA: Insufficient documentation

## 2021-09-01 DIAGNOSIS — Z01818 Encounter for other preprocedural examination: Secondary | ICD-10-CM | POA: Diagnosis not present

## 2021-09-01 HISTORY — PX: EXTRACORPOREAL SHOCK WAVE LITHOTRIPSY: SHX1557

## 2021-09-01 SURGERY — LITHOTRIPSY, ESWL
Anesthesia: LOCAL | Laterality: Right

## 2021-09-01 MED ORDER — SODIUM CHLORIDE 0.9 % IV SOLN: INTRAVENOUS | Status: DC

## 2021-09-01 MED ORDER — OXYCODONE-ACETAMINOPHEN 5-325 MG PO TABS: 1.0000 | ORAL_TABLET | Freq: Four times a day (QID) | ORAL | 0 refills | Status: DC | PRN

## 2021-09-01 MED ORDER — SENNOSIDES-DOCUSATE SODIUM 8.6-50 MG PO TABS: 1.0000 | ORAL_TABLET | Freq: Two times a day (BID) | ORAL | 0 refills | Status: DC

## 2021-09-01 MED ORDER — DIAZEPAM 5 MG PO TABS
10.0000 mg | ORAL_TABLET | ORAL | Status: AC
  Administered 2021-09-01: 10 mg via ORAL

## 2021-09-01 MED ORDER — DIPHENHYDRAMINE HCL 25 MG PO CAPS
25.0000 mg | ORAL_CAPSULE | ORAL | Status: AC
  Administered 2021-09-01: 25 mg via ORAL

## 2021-09-01 MED ORDER — CIPROFLOXACIN HCL 500 MG PO TABS
500.0000 mg | ORAL_TABLET | ORAL | Status: AC
  Administered 2021-09-01: 500 mg via ORAL

## 2021-09-01 MED ORDER — DIPHENHYDRAMINE HCL 25 MG PO CAPS
ORAL_CAPSULE | ORAL | Status: AC
  Filled 2021-09-01: qty 1

## 2021-09-01 MED ORDER — CIPROFLOXACIN HCL 500 MG PO TABS
ORAL_TABLET | ORAL | Status: AC
  Filled 2021-09-01: qty 1

## 2021-09-01 MED ORDER — DIAZEPAM 5 MG PO TABS
ORAL_TABLET | ORAL | Status: AC
  Filled 2021-09-01: qty 2

## 2021-09-01 NOTE — H&P (Signed)
Justin [name] is an 71 y.o. male.    Chief Complaint: Pre-Op RIGHT Shockwave Lithotripsy (2nd Stage)  HPI:   1 - RIGHT Renal Stone - slowly enlarging RLP stone up to 15m by KUB 2023. Now s/p Rt SWL 08/14/21 with dramatic reduction in stone burden with f/u KUB only about 535mresidual volume at L2 area. Most recetn UCX negative.   Today "JoRagepresents for 2nd stage RIGHT shockwave lithotripsy. No inverval fevers.   Past Medical History:  Diagnosis Date   Acid reflux    Asthma    GERD (gastroesophageal reflux disease)    History of kidney stones    Hypertension    IBS (irritable bowel syndrome)    Kidney stones    [facility]    Past Surgical History:  Procedure Laterality Date   ANTERIOR CRUCIATE LIGAMENT REPAIR     APPENDECTOMY     BACK SURGERY     CHOLECYSTECTOMY  2003   CHOLECYSTECTOMY     COLONOSCOPY  June 2004   Dr. RoGala Romneynormal   COLONOSCOPY N/A 08/31/2012   Procedure: COLONOSCOPY;  Surgeon: RoDaneil DolinMD;  Location: [facility];  Service: Endoscopy;  Laterality: N/A;  8:30   EXPLORATORY LAPAROTOMY     remote past   EXTRACORPOREAL SHOCK WAVE LITHOTRIPSY Right 07/03/2021   Procedure: EXTRACORPOREAL SHOCK WAVE LITHOTRIPSY (ESWL);  Surgeon: EsFestus AloeMD;  [facility];  Laterality: Right;   KNEE SURGERY  1980's.   bilateral   LUMBAR LAMINECTOMY/DECOMPRESSION MICRODISCECTOMY Bilateral 03/24/2017   Procedure: Complete Decompression Lumbar Laminectomy at L4-L5 for Spinal Stenosis, and Foraminotomy for L4 & L5 root bilaterally for foraminal stenosis.;  Surgeon: GiLatanya MaudlinMD;  Location: [facility];  Service: Orthopedics;  Laterality: Bilateral;    Family History  Problem Relation Age of Onset   Brain cancer Mother    Cancer Sister        Breast   Colon cancer Neg Hx    Social History:  reports that he has never smoked. He quit smokeless tobacco use about 10 years ago.  His smokeless tobacco  use included chew. He reports that he does not drink alcohol and does not use drugs.  Allergies: No Known Allergies  Medications Prior to Admission  Medication Sig Dispense Refill   ALPRAZolam (XANAX) 1 MG tablet Take 1 mg by mouth at bedtime as needed for sleep.      aspirin EC 81 MG tablet Take 1 tablet (81 mg total) by mouth daily. Swallow whole. 30 tablet 11   esomeprazole (NEXIUM) 40 MG capsule Take 40 mg by mouth at bedtime.     HYDROcodone-acetaminophen (NORCO/VICODIN) 5-325 MG tablet Take 1 tablet by mouth every 4 (four) hours as needed for moderate pain. 10 tablet 0   ibuprofen (ADVIL) 200 MG tablet Take 200 mg by mouth every 6 (six) hours as needed. Patient took two '200mg'$  tablets on 06/29/2021.     losartan (COZAAR) 50 MG tablet Take 50 mg by mouth 2 (two) times daily.  0   RESTASIS MULTIDOSE 0.05 % ophthalmic emulsion Place 1 drop into both eyes daily.  0   rosuvastatin (CRESTOR) 20 MG tablet Take 1 tablet (20 mg total) by mouth daily. 90 tablet 3   tamsulosin (FLOMAX) 0.4 MG CAPS capsule Take 1 capsule (0.4 mg total) by mouth daily after supper. 30 capsule 0   Probiotic Product (ALIGN PO) Take 1 capsule by mouth daily.  No results found for this or any previous visit (from the past 48 hour(s)). No results found.  Review of Systems  Constitutional:  Negative for chills and fatigue.  All other systems reviewed and are negative.   Height 6' (1.829 m), weight 89.8 kg. Physical Exam Vitals reviewed.  HENT:     Nose: Nose normal.  Eyes:     Pupils: Pupils are equal, round, and reactive to light.  Cardiovascular:     Rate and Rhythm: Normal rate.  Pulmonary:     Effort: Pulmonary effort is normal.  Abdominal:     General: Abdomen is flat.  Genitourinary:    Comments: No CVAT at present Musculoskeletal:        General: Normal range of motion.     Cervical back: Normal range of motion.  Skin:    General: Skin is warm.  Neurological:     General: No focal deficit  present.     Mental Status: He is alert.  Psychiatric:        Mood and Affect: Mood normal.      Assessment/Plan  Proceed as planned with RIGHT 2nd stage shockwave lithotripsy. Risks, benefits, alternatives, expected peri-treatment course discussed previously and reiterated today.   [name], MD 09/01/2021, 6:53 AM

## 2021-09-01 NOTE — Brief Op Note (Signed)
09/01/2021  9:27 AM  PATIENT:  Justin [name]  71 y.o. male  PRE-OPERATIVE DIAGNOSIS:  RIGHT RENAL STONE  POST-OPERATIVE DIAGNOSIS:  * No post-op diagnosis entered *  PROCEDURE:  Procedure(s): RIGHT EXTRACORPOREAL SHOCK WAVE LITHOTRIPSY (ESWL) (Right)  SURGEON:  Surgeon(s) and Role:    * [name], MD - Primary  PHYSICIAN ASSISTANT:   ASSISTANTS: none   ANESTHESIA:   MAC  EBL:  minimal   BLOOD ADMINISTERED:none  DRAINS: none   LOCAL MEDICATIONS USED:  NONE  SPECIMEN:  No Specimen  DISPOSITION OF SPECIMEN:  N/A  COUNTS:  YES  TOURNIQUET:  * No tourniquets in log *  DICTATION: .Note written in paper chart  PLAN OF CARE: Discharge to home after PACU  PATIENT DISPOSITION:  Short Stay   Delay start of Pharmacological VTE agent (>24hrs) due to surgical blood loss or risk of bleeding: yes

## 2021-09-01 NOTE — Discharge Instructions (Addendum)
1 - You may have urinary urgency (bladder spasms), pass small stone fragments, and bloody urine on / off for up to 2 weeks. This is normal.  2 - Call MD or go to ER for fever >102, severe pain / nausea / vomiting not relieved by medications, or acute change in medical status  See [facility] Instructions.  Post Anesthesia Home Care Instructions  Activity: Get plenty of rest for the remainder of the day. A responsible individual must stay with you for 24 hours following the procedure.  For the next 24 hours, DO NOT: -Drive a car -Paediatric nurse -Drink alcoholic beverages -Take any medication unless instructed by your physician -Make any legal decisions or sign important papers.  Meals: Start with liquid foods such as gelatin or soup. Progress to regular foods as tolerated. Avoid greasy, spicy, heavy foods. If nausea and/or vomiting occur, drink only clear liquids until the nausea and/or vomiting subsides. Call your physician if vomiting continues.  Special Instructions/Symptoms: Your throat may feel dry or sore from the anesthesia or the breathing tube placed in your throat during surgery. If this causes discomfort, gargle with warm salt water. The discomfort should disappear within 24 hours.

## 2021-09-01 NOTE — Brief Op Note (Signed)
09/01/2021  8:15 AM  PATIENT:  Justin [name]  71 y.o. male  PRE-OPERATIVE DIAGNOSIS:  RIGHT RENAL STONE  POST-OPERATIVE DIAGNOSIS:  * No post-op diagnosis entered *  PROCEDURE:  Procedure(s): RIGHT EXTRACORPOREAL SHOCK WAVE LITHOTRIPSY (ESWL) (Right)  SURGEON:  Surgeon(s) and Role:    * [name], MD - Primary  PHYSICIAN ASSISTANT:   ASSISTANTS: none   ANESTHESIA:   MAC  EBL:  minimal   BLOOD ADMINISTERED:none  DRAINS: none   LOCAL MEDICATIONS USED:  NONE  SPECIMEN:  No Specimen  DISPOSITION OF SPECIMEN:  N/A  COUNTS:  YES  TOURNIQUET:  * No tourniquets in log *  DICTATION: .Note written in paper chart  PLAN OF CARE: Discharge to home after PACU  PATIENT DISPOSITION:  Short Stay   Delay start of Pharmacological VTE agent (>24hrs) due to surgical blood loss or risk of bleeding: yes

## 2021-09-02 ENCOUNTER — Encounter (HOSPITAL_BASED_OUTPATIENT_CLINIC_OR_DEPARTMENT_OTHER): Payer: Self-pay | Admitting: Urology

## 2021-09-15 DIAGNOSIS — N200 Calculus of kidney: Secondary | ICD-10-CM | POA: Diagnosis not present

## 2021-10-13 ENCOUNTER — Telehealth: Payer: Self-pay | Admitting: Cardiovascular Disease

## 2021-10-13 NOTE — Telephone Encounter (Signed)
Patient stated he doesn't always take losartan '50mg'$  twice daily. This morning, BP was 153/85, P 61 so he took losartan '25mg'$ . At lunchtime, BP was 114/66, P 87. Now 109/66, P 80.  Patient is concerned about the dose of losartan and wanted to know if the dosage or medication should be changed. Please advise.

## 2021-10-13 NOTE — Telephone Encounter (Signed)
Pt c/o BP issue: STAT if pt c/o blurred vision, one-sided weakness or slurred speech  1. What are your last 5 BP readings?  111/73  HR 80  2. Are you having any other symptoms (ex. Dizziness, headache, blurred vision, passed out)? No  3. What is your BP issue? Pt states that his bp has been running lower and would like to discuss this with someone and to discuss medication management. Requesting call back.

## 2021-10-14 ENCOUNTER — Other Ambulatory Visit: Payer: Self-pay

## 2021-10-14 MED ORDER — LOSARTAN POTASSIUM 25 MG PO TABS: 25.0000 mg | ORAL_TABLET | Freq: Every day | ORAL | 3 refills | Status: DC

## 2021-10-14 MED ORDER — LOSARTAN POTASSIUM 25 MG PO TABS: 25.0000 mg | ORAL_TABLET | Freq: Two times a day (BID) | ORAL | 3 refills | Status: DC

## 2021-10-14 NOTE — Telephone Encounter (Signed)
Patient was returning call. Please advise ?

## 2021-10-14 NOTE — Telephone Encounter (Signed)
Contacted patient, made aware to call back or message in mychart in 1-2 weeks with values. Patient verbalized understanding.

## 2021-10-14 NOTE — Telephone Encounter (Signed)
LMTCB

## 2021-10-14 NOTE — Telephone Encounter (Signed)
Patient informed of losartan '25mg'$  daily. He stated BP is always higher in the mornings. This am BP 150/83. Patient has not taken med today and will take BP med before bedtime and check BP in the AM.

## 2021-10-14 NOTE — Telephone Encounter (Signed)
Patient returned call

## 2021-11-18 DIAGNOSIS — E663 Overweight: Secondary | ICD-10-CM | POA: Diagnosis not present

## 2021-11-18 DIAGNOSIS — Z23 Encounter for immunization: Secondary | ICD-10-CM | POA: Diagnosis not present

## 2021-11-18 DIAGNOSIS — N400 Enlarged prostate without lower urinary tract symptoms: Secondary | ICD-10-CM | POA: Diagnosis not present

## 2021-11-18 DIAGNOSIS — R42 Dizziness and giddiness: Secondary | ICD-10-CM | POA: Diagnosis not present

## 2021-11-18 DIAGNOSIS — Z6827 Body mass index (BMI) 27.0-27.9, adult: Secondary | ICD-10-CM | POA: Diagnosis not present

## 2021-11-18 DIAGNOSIS — I10 Essential (primary) hypertension: Secondary | ICD-10-CM | POA: Diagnosis not present

## 2021-11-20 DIAGNOSIS — N200 Calculus of kidney: Secondary | ICD-10-CM | POA: Diagnosis not present

## 2021-12-16 DIAGNOSIS — Z6827 Body mass index (BMI) 27.0-27.9, adult: Secondary | ICD-10-CM | POA: Diagnosis not present

## 2021-12-16 DIAGNOSIS — Z9229 Personal history of other drug therapy: Secondary | ICD-10-CM | POA: Diagnosis not present

## 2021-12-16 DIAGNOSIS — Z0001 Encounter for general adult medical examination with abnormal findings: Secondary | ICD-10-CM | POA: Diagnosis not present

## 2021-12-16 DIAGNOSIS — I10 Essential (primary) hypertension: Secondary | ICD-10-CM | POA: Diagnosis not present

## 2021-12-16 DIAGNOSIS — E559 Vitamin D deficiency, unspecified: Secondary | ICD-10-CM | POA: Diagnosis not present

## 2021-12-16 DIAGNOSIS — D696 Thrombocytopenia, unspecified: Secondary | ICD-10-CM | POA: Diagnosis not present

## 2021-12-16 DIAGNOSIS — E663 Overweight: Secondary | ICD-10-CM | POA: Diagnosis not present

## 2021-12-16 DIAGNOSIS — Z125 Encounter for screening for malignant neoplasm of prostate: Secondary | ICD-10-CM | POA: Diagnosis not present

## 2021-12-16 DIAGNOSIS — Z1331 Encounter for screening for depression: Secondary | ICD-10-CM | POA: Diagnosis not present

## 2021-12-16 DIAGNOSIS — D518 Other vitamin B12 deficiency anemias: Secondary | ICD-10-CM | POA: Diagnosis not present

## 2021-12-16 DIAGNOSIS — E039 Hypothyroidism, unspecified: Secondary | ICD-10-CM | POA: Diagnosis not present

## 2021-12-16 DIAGNOSIS — N400 Enlarged prostate without lower urinary tract symptoms: Secondary | ICD-10-CM | POA: Diagnosis not present

## 2021-12-22 NOTE — Progress Notes (Unsigned)
Cardiology Office Note:   Date:  12/23/2021  NAME:  Justin [name]    MRN: [id number] DOB:  10/04/1950   PCP:  Redmond School, MD  Cardiologist:  None  Electrophysiologist:  None   Referring MD: Redmond School, MD   Chief Complaint  Patient presents with   Follow-up        History of Present Illness:   Justin [name] is a 71 y.o. male with a hx of HTN, HLD, EAT, CAD who presents for follow-up.  Reports his blood pressure has been elevated in the mornings.  He was taking Flomax was blood pressure was lower.  Denies any chest pain or trouble breathing.  Had an episode of rapid heartbeat sensation but this occurs every 6 months.  LDL cholesterol at goal.  He apparently was on Flomax was blood pressure had been low.  He is now off Flomax and has been treated for kidney stones.  His blood pressure is now elevated.  180/96 today.  We discussed taking his losartan regularly.  It seems values are much higher in the mornings.  We discussed taking it at night.  Denies any symptoms today.  CV exam unremarkable.  Problem List 1.  Hypertension 2.  Hyperlipidemia -T chol 139, LDL 50, HDL 72 3. Ectopic atrial tachycardia -brief ~7.6 seconds 4. CAD -CAC score 765 (82nd percentile)  Past Medical History: Past Medical History:  Diagnosis Date   Acid reflux    Asthma    GERD (gastroesophageal reflux disease)    History of kidney stones    Hypertension    IBS (irritable bowel syndrome)    Kidney stones    Small bowel obstruction [facility])     Past Surgical History: Past Surgical History:  Procedure Laterality Date   ANTERIOR CRUCIATE LIGAMENT REPAIR     APPENDECTOMY     BACK SURGERY     CHOLECYSTECTOMY  2003   CHOLECYSTECTOMY     COLONOSCOPY  June 2004   Dr. [name]: normal   COLONOSCOPY N/A 08/31/2012   Procedure: COLONOSCOPY;  Surgeon: [name], MD;  [facility];  Service: Endoscopy;  Laterality: N/A;  8:30   EXPLORATORY LAPAROTOMY     remote past    EXTRACORPOREAL SHOCK WAVE LITHOTRIPSY Right 07/03/2021   Procedure: EXTRACORPOREAL SHOCK WAVE LITHOTRIPSY (ESWL);  Surgeon: Festus Aloe, MD;  Location: [facility];  Service: Urology;  Laterality: Right;   EXTRACORPOREAL SHOCK WAVE LITHOTRIPSY Right 09/01/2021   Procedure: RIGHT EXTRACORPOREAL SHOCK WAVE LITHOTRIPSY (ESWL);  Surgeon: [name], MD;  Location: [facility];  Service: Urology;  Laterality: Right;   KNEE SURGERY  1980's.   bilateral   LUMBAR LAMINECTOMY/DECOMPRESSION MICRODISCECTOMY Bilateral 03/24/2017   Procedure: Complete Decompression Lumbar Laminectomy at L4-L5 for Spinal Stenosis, and Foraminotomy for L4 & L5 root bilaterally for foraminal stenosis.;  Surgeon: Latanya Maudlin, MD;  Location: [facility];  Service: Orthopedics;  Laterality: Bilateral;    Current Medications: Current Meds  Medication Sig   ALPRAZolam (XANAX) 1 MG tablet Take 1 mg by mouth at bedtime as needed for sleep.    aspirin EC 81 MG tablet Take 1 tablet (81 mg total) by mouth daily. Swallow whole.   esomeprazole (NEXIUM) 40 MG capsule Take 40 mg by mouth at bedtime.   ibuprofen (ADVIL) 200 MG tablet Take 200 mg by mouth every 6 (six) hours as needed. Patient took two '200mg'$  tablets on 06/29/2021.   RESTASIS MULTIDOSE 0.05 % ophthalmic emulsion Place 1 drop  into both eyes daily.   [DISCONTINUED] rosuvastatin (CRESTOR) 20 MG tablet Take 1 tablet (20 mg total) by mouth daily.     Allergies:    Patient has no known allergies.   Social History: Social History   Socioeconomic History   Marital status: Married    Spouse name: Jana Half   Number of children: 3   Years of education: 12   Highest education level: Not on file  Occupational History   Occupation: Chartered certified accountant: [employer]    Comment: over 7 plants in [place] and [place]  Tobacco Use   Smoking status: Never   Smokeless tobacco: Former    Types: Chew     Quit date: 2013  Vaping Use   Vaping Use: Never used  Substance and Sexual Activity   Alcohol use: No   Drug use: No   Sexual activity: Not on file  Other Topics Concern   Not on file  Social History Narrative   ** Merged History Encounter **       Consumes caffeine occas, is left handed   Social Determinants of Radio broadcast assistant Strain: Not on file  Food Insecurity: Not on file  Transportation Needs: Not on file  Physical Activity: Not on file  Stress: Not on file  Social Connections: Not on file     Family History: The patient's family history includes Brain cancer in his mother; Cancer in his sister. There is no history of Colon cancer.  ROS:   All other ROS reviewed and negative. Pertinent positives noted in the HPI.     EKGs/Labs/Other Studies Reviewed:   The following studies were personally reviewed by me today:  Recent Labs: No results found for requested labs within last 365 days.   Recent Lipid Panel No results found for: "CHOL", "TRIG", "HDL", "CHOLHDL", "VLDL", "LDLCALC", "LDLDIRECT"  Physical Exam:   VS:  BP (!) 180/96   Pulse (!) 57   Ht 6' (1.829 m)   Wt 206 lb 3.2 oz (93.5 kg)   SpO2 99%   BMI 27.97 kg/m    Wt Readings from Last 3 Encounters:  12/23/21 206 lb 3.2 oz (93.5 kg)  09/01/21 197 lb (89.4 kg)  07/03/21 201 lb 4.8 oz (91.3 kg)    General: Well nourished, well developed, in no acute distress Head: Atraumatic, normal size  Eyes: PEERLA, EOMI  Neck: Supple, no JVD Endocrine: No thryomegaly Cardiac: Normal S1, S2; RRR; no murmurs, rubs, or gallops Lungs: Clear to auscultation bilaterally, no wheezing, rhonchi or rales  Abd: Soft, nontender, no hepatomegaly  Ext: No edema, pulses 2+ Musculoskeletal: No deformities, BUE and BLE strength normal and equal Skin: Warm and dry, no rashes   Neuro: Alert and oriented to person, place, time, and situation, CNII-XII grossly intact, no focal deficits  Psych: Normal mood and affect    ASSESSMENT:   Justin [name] is a 71 y.o. male who presents for the following: 1. Coronary artery disease involving native coronary artery of native heart without angina pectoris   2. Agatston coronary artery calcium score greater than 400   3. Mixed hyperlipidemia   4. Primary hypertension   5. Atrial tachycardia     PLAN:   1. Coronary artery disease involving native coronary artery of native heart without angina pectoris 2. Agatston coronary artery calcium score greater than 400 3. Mixed hyperlipidemia -Elevated calcium score.  No symptoms of angina.  Continue aspirin 81  mg daily.  On Crestor 20 mg daily.  Refills provided.  LDL 50 which is at goal.  4. Primary hypertension -Blood pressure elevated today.  Values seem to be higher in the mornings.  Would recommend he take losartan regularly.  He will take 25 mg at night.  He will check his blood pressure daily.  5. Atrial tachycardia -Infrequent episodes.  Occur every 6 months.  No medications needed.  Disposition: Return in about 6 months (around 06/23/2022).  Medication Adjustments/Labs and Tests Ordered: Current medicines are reviewed at length with the patient today.  Concerns regarding medicines are outlined above.  No orders of the defined types were placed in this encounter.  Meds ordered this encounter  Medications   losartan (COZAAR) 25 MG tablet    Sig: Take 1 tablet (25 mg total) by mouth at bedtime.    Dispense:  90 tablet    Refill:  3   rosuvastatin (CRESTOR) 20 MG tablet    Sig: Take 1 tablet (20 mg total) by mouth daily.    Dispense:  90 tablet    Refill:  3    Patient Instructions  Medication Instructions:  Take Losartan 25 mg daily at night   *If you need a refill on your cardiac medications before your next appointment, please call your pharmacy*   Follow-Up: At [facility], you and your health needs are our priority.  As part of our continuing mission to provide you with exceptional  heart care, we have created designated Provider Care Teams.  These Care Teams include your primary Cardiologist (physician) and Advanced Practice Providers (APPs -  Physician Assistants and Nurse Practitioners) who all work together to provide you with the care you need, when you need it.  We recommend signing up for the patient portal called "MyChart".  Sign up information is provided on this After Visit Summary.  MyChart is used to connect with patients for Virtual Visits (Telemedicine).  Patients are able to view lab/test results, encounter notes, upcoming appointments, etc.  Non-urgent messages can be sent to your provider as well.   To learn more about what you can do with MyChart, go to [email or web address].    Your next appointment:   6 month(s)  The format for your next appointment:   In Person  Provider:   Eleonore Chiquito, MD           Time Spent with Patient: I have spent a total of 35 minutes with patient reviewing hospital notes, telemetry, EKGs, labs and examining the patient as well as establishing an assessment and plan that was discussed with the patient.  > 50% of time was spent in direct patient care.  Signed, Addison Naegeli. Audie Box, MD, [facility]  [address] [place], [place] [address] [phone]  12/23/2021 3:23 PM

## 2021-12-23 ENCOUNTER — Encounter: Payer: Self-pay | Admitting: Cardiovascular Disease

## 2021-12-23 ENCOUNTER — Ambulatory Visit: Payer: Medicare Other | Attending: Cardiovascular Disease | Admitting: Cardiovascular Disease

## 2021-12-23 VITALS — BP 180/96 | HR 57 | Ht 72.0 in | Wt 206.2 lb

## 2021-12-23 DIAGNOSIS — I4719 Other supraventricular tachycardia: Secondary | ICD-10-CM | POA: Diagnosis not present

## 2021-12-23 DIAGNOSIS — R931 Abnormal findings on diagnostic imaging of heart and coronary circulation: Secondary | ICD-10-CM | POA: Insufficient documentation

## 2021-12-23 DIAGNOSIS — I2510 Atherosclerotic heart disease of native coronary artery without angina pectoris: Secondary | ICD-10-CM | POA: Insufficient documentation

## 2021-12-23 DIAGNOSIS — E782 Mixed hyperlipidemia: Secondary | ICD-10-CM | POA: Insufficient documentation

## 2021-12-23 DIAGNOSIS — I10 Essential (primary) hypertension: Secondary | ICD-10-CM | POA: Insufficient documentation

## 2021-12-23 MED ORDER — ROSUVASTATIN CALCIUM 20 MG PO TABS
20.0000 mg | ORAL_TABLET | Freq: Every day | ORAL | 3 refills | Status: DC
Start: 2021-12-23 — End: 2022-01-23

## 2021-12-23 MED ORDER — LOSARTAN POTASSIUM 25 MG PO TABS: 25.0000 mg | ORAL_TABLET | Freq: Every day | ORAL | 3 refills | Status: AC

## 2021-12-23 NOTE — Patient Instructions (Signed)
Medication Instructions:  Take Losartan 25 mg daily at night   *If you need a refill on your cardiac medications before your next appointment, please call your pharmacy*   Follow-Up: At [facility], you and your health needs are our priority.  As part of our continuing mission to provide you with exceptional heart care, we have created designated Provider Care Teams.  These Care Teams include your primary Cardiologist (physician) and Advanced Practice Providers (APPs -  Physician Assistants and Nurse Practitioners) who all work together to provide you with the care you need, when you need it.  We recommend signing up for the patient portal called "MyChart".  Sign up information is provided on this After Visit Summary.  MyChart is used to connect with patients for Virtual Visits (Telemedicine).  Patients are able to view lab/test results, encounter notes, upcoming appointments, etc.  Non-urgent messages can be sent to your provider as well.   To learn more about what you can do with MyChart, go to [email or web address].    Your next appointment:   6 month(s)  The format for your next appointment:   In Person  Provider:   Eleonore Chiquito, MD

## 2022-01-23 ENCOUNTER — Other Ambulatory Visit: Payer: Self-pay | Admitting: Cardiovascular Disease

## 2022-02-04 ENCOUNTER — Ambulatory Visit: Payer: Medicare Other | Admitting: Cardiovascular Disease

## 2022-02-19 DIAGNOSIS — N400 Enlarged prostate without lower urinary tract symptoms: Secondary | ICD-10-CM | POA: Diagnosis not present

## 2022-03-15 DIAGNOSIS — R030 Elevated blood-pressure reading, without diagnosis of hypertension: Secondary | ICD-10-CM | POA: Diagnosis not present

## 2022-03-15 DIAGNOSIS — R059 Cough, unspecified: Secondary | ICD-10-CM | POA: Diagnosis not present

## 2022-03-15 DIAGNOSIS — J069 Acute upper respiratory infection, unspecified: Secondary | ICD-10-CM | POA: Diagnosis not present

## 2022-03-15 DIAGNOSIS — Z6823 Body mass index (BMI) 23.0-23.9, adult: Secondary | ICD-10-CM | POA: Diagnosis not present

## 2022-03-19 DIAGNOSIS — H6121 Impacted cerumen, right ear: Secondary | ICD-10-CM | POA: Diagnosis not present

## 2022-03-19 DIAGNOSIS — Z6823 Body mass index (BMI) 23.0-23.9, adult: Secondary | ICD-10-CM | POA: Diagnosis not present

## 2022-03-19 DIAGNOSIS — J40 Bronchitis, not specified as acute or chronic: Secondary | ICD-10-CM | POA: Diagnosis not present

## 2022-04-16 DIAGNOSIS — R030 Elevated blood-pressure reading, without diagnosis of hypertension: Secondary | ICD-10-CM | POA: Diagnosis not present

## 2022-04-16 DIAGNOSIS — J329 Chronic sinusitis, unspecified: Secondary | ICD-10-CM | POA: Diagnosis not present

## 2022-04-16 DIAGNOSIS — Z6829 Body mass index (BMI) 29.0-29.9, adult: Secondary | ICD-10-CM | POA: Diagnosis not present

## 2022-04-16 DIAGNOSIS — E663 Overweight: Secondary | ICD-10-CM | POA: Diagnosis not present

## 2022-04-23 DIAGNOSIS — Z6830 Body mass index (BMI) 30.0-30.9, adult: Secondary | ICD-10-CM | POA: Diagnosis not present

## 2022-04-23 DIAGNOSIS — E669 Obesity, unspecified: Secondary | ICD-10-CM | POA: Diagnosis not present

## 2022-04-23 DIAGNOSIS — J45901 Unspecified asthma with (acute) exacerbation: Secondary | ICD-10-CM | POA: Diagnosis not present

## 2022-05-06 ENCOUNTER — Encounter (HOSPITAL_BASED_OUTPATIENT_CLINIC_OR_DEPARTMENT_OTHER): Payer: Self-pay | Admitting: Pulmonary Disease

## 2022-05-06 ENCOUNTER — Ambulatory Visit (INDEPENDENT_AMBULATORY_CARE_PROVIDER_SITE_OTHER): Payer: Medicare Other | Admitting: Pulmonary Disease

## 2022-05-06 VITALS — BP 130/82 | HR 72 | Temp 97.7°F | Ht 72.0 in | Wt 208.0 lb

## 2022-05-06 DIAGNOSIS — R053 Chronic cough: Secondary | ICD-10-CM | POA: Diagnosis not present

## 2022-05-06 NOTE — Patient Instructions (Signed)
Chronic cough may be related to post nasal drip, reflux or less likely, asthma  Trial of chlorpheniramine 4 mg at bedtime x  2- 3 weeks + phenylephrine 10 mg daytime  (combination [employer] brand 'sinus PE')  We discussed treatment of reflux  X schedule spirometry pre-post

## 2022-05-06 NOTE — Assessment & Plan Note (Addendum)
Chronic cough may be related to post nasal drip, reflux or less likely, asthma Previous imaging has been negative for ILD  Trial of chlorpheniramine 4 mg at bedtime x  2- 3 weeks + phenylephrine 10 mg daytime  (combination [employer] brand 'sinus PE')  We discussed treatment of reflux with PPI/Nexium and nonpharmacological measures  X schedule spirometry pre-post -doubt cough variant asthma but he does report mild benefit with bronchodilators

## 2022-05-06 NOTE — Progress Notes (Signed)
Subjective:    Patient ID: Justin [name], male    DOB: 20-Mar-1950, 72 y.o.   MRN: [id number]  HPI  Chief Complaint  Patient presents with   Pulmonary Consult    Referred by Urgent Care. Pt c/o cough since Jan 2024. It has been better in the past few wks. His cough is non prod. He occ wakes up in the night coughing.    72 year old never smoker presents for evaluation of chronic cough. He reports an intermittent cough that woke him up for a few weeks, ongoing for many years.  This particular episode started around mid January, no URI symptoms at onset, no sick contacts, reports very deep dry cough that would occur day or night, he went to urgent care about 4 times, chest x-ray was negative.  He received 1 course of antibiotic and 2 rounds of prednisone.  He was given albuterol and ipratropium nebs which seemed to help a little bit. For the past week he has been having postnasal drip which she reports during spring and fall.  He started taking Sudafed and this has helped somewhat. He reports GERD-like symptoms for many years and has been taking Nexium  Environment -he worked as a Air cabin crew.  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  He is also an avid rabbit hunter and has hunting dogs   PMH -hypertension Intestinal obstruction  Significant tests/ events reviewed  CT cors 04/2020 39m RLL subpleural LN   Past Medical History:  Diagnosis Date   Acid reflux    Asthma    GERD (gastroesophageal reflux disease)    History of kidney stones    Hypertension    IBS (irritable bowel syndrome)    Kidney stones    Small bowel obstruction ([facility]    Past Surgical History:  Procedure Laterality Date   ANTERIOR CRUCIATE LIGAMENT REPAIR     APPENDECTOMY     BACK SURGERY     CHOLECYSTECTOMY  2003   CHOLECYSTECTOMY     COLONOSCOPY  June 2004   Dr. RGala Romney normal   COLONOSCOPY N/A 08/31/2012   Procedure: COLONOSCOPY;  Surgeon: RDaneil Dolin  MD;  [facility];  Service: Endoscopy;  Laterality: N/A;  8:30   EXPLORATORY LAPAROTOMY     remote past   EXTRACORPOREAL SHOCK WAVE LITHOTRIPSY Right 07/03/2021   Procedure: EXTRACORPOREAL SHOCK WAVE LITHOTRIPSY (ESWL);  Surgeon: EFestus Aloe MD;  [facility]  Service: Urology;  Laterality: Right;   EXTRACORPOREAL SHOCK WAVE LITHOTRIPSY Right 09/01/2021   Procedure: RIGHT EXTRACORPOREAL SHOCK WAVE LITHOTRIPSY (ESWL);  Surgeon: MAlexis Frock MD;  [facility]  Service: Urology;  Laterality: Right;   KNEE SURGERY  1980's.   bilateral   LUMBAR LAMINECTOMY/DECOMPRESSION MICRODISCECTOMY Bilateral 03/24/2017   Procedure: Complete Decompression Lumbar Laminectomy at L4-L5 for Spinal Stenosis, and Foraminotomy for L4 & L5 root bilaterally for foraminal stenosis.;  Surgeon: GLatanya Maudlin MD;  [facility];  Service: Orthopedics;  Laterality: Bilateral;    No Known Allergies  Social History   Socioeconomic History   Marital status: Married    Spouse name: MJana Half  Number of children: 3   Years of education: 12   Highest education level: Not on file  Occupational History   [place] Tobacco Use  Smoking status: Never   Smokeless tobacco: Former    Types: Chew    Quit date: 2013  Vaping Use   Vaping Use: Never used  Substance and Sexual Activity   Alcohol use: No   Drug use: No   Sexual activity: Not on file  Other Topics Concern   Not on file  Social History Narrative   ** Merged History Encounter **       Consumes caffeine occas, is left handed   Social Determinants of Radio broadcast assistant Strain: Not on file  Food Insecurity: Not on file  Transportation Needs: Not on file  Physical Activity: Not on file  Stress: Not on file  Social Connections: Not on file   Intimate Partner Violence: Not on file    Family History  Problem Relation Age of Onset   Brain cancer Mother    Cancer Sister        Breast   Colon cancer Neg Hx      Review of Systems Constitutional: negative for anorexia, fevers and sweats  Eyes: negative for irritation, redness and visual disturbance  Ears, nose, mouth, throat, and face: negative for earaches, epistaxis, nasal congestion and sore throat  Respiratory: negative for cough, dyspnea on exertion, sputum and wheezing  Cardiovascular: negative for chest pain, dyspnea, lower extremity edema, orthopnea, palpitations and syncope  Gastrointestinal: negative for abdominal pain, constipation, diarrhea, melena, nausea and vomiting  Genitourinary:negative for dysuria, frequency and hematuria  Hematologic/lymphatic: negative for bleeding, easy bruising and lymphadenopathy  Musculoskeletal:negative for arthralgias, muscle weakness and stiff joints  Neurological: negative for coordination problems, gait problems, headaches and weakness  Endocrine: negative for diabetic symptoms including polydipsia, polyuria and weight loss     Objective:   Physical Exam  Gen. Pleasant, well-nourished, in no distress, normal affect ENT - no pallor,icterus, no post nasal drip Neck: No JVD, no thyromegaly, no carotid bruits Lungs: no use of accessory muscles, no dullness to percussion, clear without rales or rhonchi  Cardiovascular: Rhythm regular, heart sounds  normal, no murmurs or gallops, no peripheral edema Abdomen: soft and non-tender, no hepatosplenomegaly, BS normal. Musculoskeletal: No deformities, no cyanosis or clubbing Neuro:  alert, non focal       Assessment & Plan:

## 2022-05-07 ENCOUNTER — Other Ambulatory Visit (HOSPITAL_BASED_OUTPATIENT_CLINIC_OR_DEPARTMENT_OTHER): Payer: Self-pay

## 2022-05-07 DIAGNOSIS — K219 Gastro-esophageal reflux disease without esophagitis: Secondary | ICD-10-CM | POA: Diagnosis not present

## 2022-05-07 DIAGNOSIS — R053 Chronic cough: Secondary | ICD-10-CM | POA: Diagnosis not present

## 2022-05-07 DIAGNOSIS — M1000 Idiopathic gout, unspecified site: Secondary | ICD-10-CM | POA: Diagnosis not present

## 2022-05-07 DIAGNOSIS — M1991 Primary osteoarthritis, unspecified site: Secondary | ICD-10-CM | POA: Diagnosis not present

## 2022-05-07 DIAGNOSIS — Z6827 Body mass index (BMI) 27.0-27.9, adult: Secondary | ICD-10-CM | POA: Diagnosis not present

## 2022-05-07 DIAGNOSIS — I10 Essential (primary) hypertension: Secondary | ICD-10-CM | POA: Diagnosis not present

## 2022-05-07 DIAGNOSIS — E663 Overweight: Secondary | ICD-10-CM | POA: Diagnosis not present

## 2022-05-08 ENCOUNTER — Ambulatory Visit (INDEPENDENT_AMBULATORY_CARE_PROVIDER_SITE_OTHER): Payer: Medicare Other | Admitting: Pulmonary Disease

## 2022-05-08 DIAGNOSIS — R053 Chronic cough: Secondary | ICD-10-CM

## 2022-05-08 LAB — PULMONARY FUNCTION TEST
DL/VA % pred: 112 %
DL/VA: 4.48 ml/min/mmHg/L
DLCO cor % pred: 111 %
DLCO cor: 30.54 ml/min/mmHg
DLCO unc % pred: 111 %
DLCO unc: 30.54 ml/min/mmHg
FEF 25-75 Post: 3.65 L/sec
FEF 25-75 Pre: 5.01 L/sec
FEF2575-%Change-Post: -27 %
FEF2575-%Pred-Post: 139 %
FEF2575-%Pred-Pre: 191 %
FEV1-%Change-Post: -2 %
FEV1-%Pred-Post: 110 %
FEV1-%Pred-Pre: 112 %
FEV1-Post: 3.83 L
FEV1-Pre: 3.93 L
FEV1FVC-%Change-Post: -7 %
FEV1FVC-%Pred-Pre: 116 %
FEV6-%Change-Post: 3 %
FEV6-%Pred-Post: 106 %
FEV6-%Pred-Pre: 102 %
FEV6-Post: 4.77 L
FEV6-Pre: 4.6 L
FEV6FVC-%Change-Post: 0 %
FEV6FVC-%Pred-Post: 105 %
FEV6FVC-%Pred-Pre: 105 %
FVC-%Change-Post: 5 %
FVC-%Pred-Post: 102 %
FVC-%Pred-Pre: 97 %
FVC-Post: 4.85 L
FVC-Pre: 4.6 L
Post FEV1/FVC ratio: 79 %
Post FEV6/FVC ratio: 100 %
Pre FEV1/FVC ratio: 85 %
Pre FEV6/FVC Ratio: 100 %

## 2022-05-08 NOTE — Progress Notes (Signed)
Pre/Post Spirometry and DLCO Performed Today.  

## 2022-05-08 NOTE — Patient Instructions (Signed)
Pre/Post Spirometry and DLCO Performed Today.  

## 2022-07-30 DIAGNOSIS — M545 Low back pain, unspecified: Secondary | ICD-10-CM | POA: Diagnosis not present

## 2022-09-24 DIAGNOSIS — H43393 Other vitreous opacities, bilateral: Secondary | ICD-10-CM | POA: Diagnosis not present

## 2022-10-05 NOTE — Progress Notes (Unsigned)
Cardiology Office Note:   Date:  10/07/2022  NAME:  Justin [name]    MRN: [id number] DOB:  08/08/1950   PCP:  Elfredia Nevins, MD  Cardiologist:  Reatha Harps, MD  Electrophysiologist:  None   Referring MD: Elfredia Nevins, MD   Chief Complaint  Patient presents with   Follow-up    History of Present Illness:   Justin [name] is a 72 y.o. male with a hx of HTN, HLD, A tach, CAD who presents for follow-up.  He reports that he has had palpitations for the past 2 to 3 weeks.  Described as skipping.  Has occurred while sitting.  Has been on prednisone for the past few weeks due to back pain.  Symptoms have been worsened by this.  We did discuss that prednisone will elevate his blood pressure and can exacerbate symptoms of palpitations.  I suspect this is what is going on.  Despite the episodes they are not that bothersome.  They are very brief.  They can last several minutes.  Described as skipped or extra heartbeats.  No symptoms of chest pains or trouble breathing.  LDL cholesterol is at goal.  Does take aspirin and noticed some bruising.  We did discuss he can take aspirin every other day if needed.  Overall things seem to be stable.  Problem List 1.  Hypertension 2.  Hyperlipidemia -T chol 139, LDL 50, HDL 72, TG 91 3. Ectopic atrial tachycardia -brief ~7.6 seconds 4. CAD -CAC score 765 (82nd percentile)  Past Medical History: Past Medical History:  Diagnosis Date   Acid reflux    Asthma    GERD (gastroesophageal reflux disease)    History of kidney stones    Hypertension    IBS (irritable bowel syndrome)    Kidney stones    Small bowel obstruction [facility])     Past Surgical History: Past Surgical History:  Procedure Laterality Date   ANTERIOR CRUCIATE LIGAMENT REPAIR     APPENDECTOMY     BACK SURGERY     CHOLECYSTECTOMY  2003   CHOLECYSTECTOMY     COLONOSCOPY  June 2004   Dr. Jena Gauss: normal   COLONOSCOPY N/A 08/31/2012   Procedure: COLONOSCOPY;  Surgeon: Corbin Ade, MD;  [facility];  Service: Endoscopy;  Laterality: N/A;  8:30   EXPLORATORY LAPAROTOMY     remote past   EXTRACORPOREAL SHOCK WAVE LITHOTRIPSY Right 07/03/2021   Procedure: EXTRACORPOREAL SHOCK WAVE LITHOTRIPSY (ESWL);  Surgeon: Jerilee Field, MD;  Location: [facility];  Service: Urology;  Laterality: Right;   EXTRACORPOREAL SHOCK WAVE LITHOTRIPSY Right 09/01/2021   Procedure: RIGHT EXTRACORPOREAL SHOCK WAVE LITHOTRIPSY (ESWL);  Surgeon: Sebastian Ache, MD;  Location: [facility];  Service: Urology;  Laterality: Right;   KNEE SURGERY  1980's.   bilateral   LUMBAR LAMINECTOMY/DECOMPRESSION MICRODISCECTOMY Bilateral 03/24/2017   Procedure: Complete Decompression Lumbar Laminectomy at L4-L5 for Spinal Stenosis, and Foraminotomy for L4 & L5 root bilaterally for foraminal stenosis.;  Surgeon: Ranee Gosselin, MD;  Location: [facility];  Service: Orthopedics;  Laterality: Bilateral;    Current Medications: Current Meds  Medication Sig   ALPRAZolam (XANAX) 1 MG tablet Take 1 mg by mouth at bedtime as needed for sleep.    aspirin EC 81 MG tablet Take 1 tablet (81 mg total) by mouth daily. Swallow whole.   esomeprazole (NEXIUM) 40 MG capsule Take 40 mg by mouth at bedtime.   ibuprofen (ADVIL) 200 MG tablet Take 200  mg by mouth every 6 (six) hours as needed. Patient took two 200mg  tablets on 06/29/2021.   losartan (COZAAR) 25 MG tablet Take 1 tablet (25 mg total) by mouth at bedtime.   RESTASIS MULTIDOSE 0.05 % ophthalmic emulsion Place 1 drop into both eyes daily.   [DISCONTINUED] rosuvastatin (CRESTOR) 20 MG tablet TAKE 1 TABLET(20 MG) BY MOUTH DAILY     Allergies:    Patient has no known allergies.   Social History: Social History   Socioeconomic History   Marital status: Married    Spouse name: Johnny Bridge   Number of children: 3   Years of education: 12   Highest education level: Not on file  Occupational History   Occupation: Designer, television/film set: [employer]    Comment: over 7 plants in [place]  Tobacco Use   Smoking status: Never   Smokeless tobacco: Former    Types: Chew    Quit date: 2013  Vaping Use   Vaping status: Never Used  Substance and Sexual Activity   Alcohol use: No   Drug use: No   Sexual activity: Not on file  Other Topics Concern   Not on file  Social History Narrative   ** Merged History Encounter **       Consumes caffeine occas, is left handed   Social Determinants of Corporate investment banker Strain: Not on file  Food Insecurity: Not on file  Transportation Needs: Not on file  Physical Activity: Not on file  Stress: Not on file  Social Connections: Not on file     Family History: The patient's family history includes Brain cancer in his mother; Cancer in his sister. There is no history of Colon cancer.  ROS:   All other ROS reviewed and negative. Pertinent positives noted in the HPI.     EKGs/Labs/Other Studies Reviewed:   The following studies were personally reviewed by me today:  EKG:  EKG is ordered today.    EKG Interpretation Date/Time:  Wednesday October 07 2022 11:12:22 EDT Ventricular Rate:  53 PR Interval:  184 QRS Duration:  96 QT Interval:  416 QTC Calculation: 390 R Axis:   48  Text Interpretation: Sinus bradycardia When compared with ECG of 08-Aug-2017 09:43, Confirmed by Lennie Odor [phone]) on 10/07/2022 11:15:55 AM   Recent Labs: No results found for requested labs within last 365 days.   Recent Lipid Panel No results found for: "CHOL", "TRIG", "HDL", "CHOLHDL", "VLDL", "LDLCALC", "LDLDIRECT"  Physical Exam:   VS:  BP (!) 142/82 (BP Location: Left Arm, Patient Position: Sitting, Cuff Size: Normal)   Pulse 62   Ht 6' (1.829 m)   Wt 203 lb 6.4 oz (92.3 kg)   SpO2 99%   BMI 27.59 kg/m    Wt Readings from Last 3 Encounters:  10/07/22 203 lb 6.4 oz (92.3 kg)  05/06/22 208 lb (94.3 kg)   12/23/21 206 lb 3.2 oz (93.5 kg)    General: Well nourished, well developed, in no acute distress Head: Atraumatic, normal size  Eyes: PEERLA, EOMI  Neck: Supple, no JVD Endocrine: No thryomegaly Cardiac: Normal S1, S2; RRR; no murmurs, rubs, or gallops Lungs: Clear to auscultation bilaterally, no wheezing, rhonchi or rales  Abd: Soft, nontender, no hepatomegaly  Ext: No edema, pulses 2+ Musculoskeletal: No deformities, BUE and BLE strength normal and equal Skin: Warm and dry, no rashes   Neuro: Alert and oriented to person, place, time,  and situation, CNII-XII grossly intact, no focal deficits  Psych: Normal mood and affect   ASSESSMENT:   Justin [name] is a 72 y.o. male who presents for the following: 1. Coronary artery disease involving native coronary artery of native heart without angina pectoris   2. Agatston coronary artery calcium score greater than 400   3. Mixed hyperlipidemia   4. Primary hypertension   5. Atrial tachycardia   6. Palpitations     PLAN:   1. Coronary artery disease involving native coronary artery of native heart without angina pectoris 2. Agatston coronary artery calcium score greater than 400 3. Mixed hyperlipidemia -Elevated coronary calcium score.  No symptoms of angina.  Continue aspirin.  Can take aspirin every other day if needed.  Reports some bruising.  Also could stop.  I think the best option is to really aggressively treat his cholesterol.  His LDL is 50.  He will continue Crestor 20 mg daily.  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  No symptoms of angina today.  4. Primary hypertension -Elevated due to recent prednisone use.  Continue current medications.  No change.  5. Atrial tachycardia 6. Palpitations -Worsening palpitations over the past few weeks.  Likely related to prednisone use.  I advised him to keep an eye on it.  Symptoms should improve once he is off prednisone.  They do not he will reach back out.  We could  discuss treatment with metoprolol.      Disposition: Return in about 1 year (around 10/07/2023).  Medication Adjustments/Labs and Tests Ordered: Current medicines are reviewed at length with the patient today.  Concerns regarding medicines are outlined above.  Orders Placed This Encounter  Procedures   EKG 12-Lead   Meds ordered this encounter  Medications   rosuvastatin (CRESTOR) 20 MG tablet    Sig: Take 1 tablet (20 mg total) by mouth daily.    Dispense:  90 tablet    Refill:  3   Patient Instructions  Medication Instructions:  Your physician recommends that you continue on your current medications as directed. Please refer to the Current Medication list given to you today.  *If you need a refill on your cardiac medications before your next appointment, please call your pharmacy*    Follow-Up: At [facility], you and your health needs are our priority.  As part of our continuing mission to provide you with exceptional heart care, we have created designated Provider Care Teams.  These Care Teams include your primary Cardiologist (physician) and Advanced Practice Providers (APPs -  Physician Assistants and Nurse Practitioners) who all work together to provide you with the care you need, when you need it.  We recommend signing up for the patient portal called "MyChart".  Sign up information is provided on this After Visit Summary.  MyChart is used to connect with patients for Virtual Visits (Telemedicine).  Patients are able to view lab/test results, encounter notes, upcoming appointments, etc.  Non-urgent messages can be sent to your provider as well.   To learn more about what you can do with MyChart, go to [email or web address].    Your next appointment:   1 year(s)  Provider:   Reatha Harps, MD       Time Spent with Patient: I have spent a total of 25 minutes with patient reviewing hospital notes, telemetry, EKGs, labs and examining the patient as well as  establishing an assessment and plan that was discussed with the patient.  >  50% of time was spent in direct patient care.  Signed, Lenna Gilford. Flora Lipps, MD, [facility]  [facility]  [address] [place], [place] [address] [phone]  10/07/2022 11:40 AM

## 2022-10-07 ENCOUNTER — Encounter: Payer: Self-pay | Admitting: Cardiovascular Disease

## 2022-10-07 ENCOUNTER — Ambulatory Visit: Payer: Medicare Other | Attending: Cardiovascular Disease | Admitting: Cardiovascular Disease

## 2022-10-07 VITALS — BP 142/82 | HR 62 | Ht 72.0 in | Wt 203.4 lb

## 2022-10-07 DIAGNOSIS — R931 Abnormal findings on diagnostic imaging of heart and coronary circulation: Secondary | ICD-10-CM | POA: Diagnosis not present

## 2022-10-07 DIAGNOSIS — E782 Mixed hyperlipidemia: Secondary | ICD-10-CM | POA: Diagnosis not present

## 2022-10-07 DIAGNOSIS — I10 Essential (primary) hypertension: Secondary | ICD-10-CM | POA: Insufficient documentation

## 2022-10-07 DIAGNOSIS — R002 Palpitations: Secondary | ICD-10-CM | POA: Diagnosis not present

## 2022-10-07 DIAGNOSIS — I4719 Other supraventricular tachycardia: Secondary | ICD-10-CM | POA: Insufficient documentation

## 2022-10-07 DIAGNOSIS — I2510 Atherosclerotic heart disease of native coronary artery without angina pectoris: Secondary | ICD-10-CM | POA: Insufficient documentation

## 2022-10-07 MED ORDER — ROSUVASTATIN CALCIUM 20 MG PO TABS: 20.0000 mg | ORAL_TABLET | Freq: Every day | ORAL | 3 refills | Status: DC

## 2022-10-07 NOTE — Patient Instructions (Signed)
Medication Instructions:  Your physician recommends that you continue on your current medications as directed. Please refer to the Current Medication list given to you today.  *If you need a refill on your cardiac medications before your next appointment, please call your pharmacy*    Follow-Up: At [facility], you and your health needs are our priority.  As part of our continuing mission to provide you with exceptional heart care, we have created designated Provider Care Teams.  These Care Teams include your primary Cardiologist (physician) and Advanced Practice Providers (APPs -  Physician Assistants and Nurse Practitioners) who all work together to provide you with the care you need, when you need it.  We recommend signing up for the patient portal called "MyChart".  Sign up information is provided on this After Visit Summary.  MyChart is used to connect with patients for Virtual Visits (Telemedicine).  Patients are able to view lab/test results, encounter notes, upcoming appointments, etc.  Non-urgent messages can be sent to your provider as well.   To learn more about what you can do with MyChart, go to [email or web address].    Your next appointment:   1 year(s)  Provider:   Reatha Harps, MD

## 2022-11-18 DIAGNOSIS — N401 Enlarged prostate with lower urinary tract symptoms: Secondary | ICD-10-CM | POA: Diagnosis not present

## 2022-11-18 DIAGNOSIS — N5201 Erectile dysfunction due to arterial insufficiency: Secondary | ICD-10-CM | POA: Diagnosis not present

## 2022-11-18 DIAGNOSIS — R351 Nocturia: Secondary | ICD-10-CM | POA: Diagnosis not present

## 2022-12-22 DIAGNOSIS — Z6827 Body mass index (BMI) 27.0-27.9, adult: Secondary | ICD-10-CM | POA: Diagnosis not present

## 2022-12-22 DIAGNOSIS — G9332 Myalgic encephalomyelitis/chronic fatigue syndrome: Secondary | ICD-10-CM | POA: Diagnosis not present

## 2022-12-22 DIAGNOSIS — E663 Overweight: Secondary | ICD-10-CM | POA: Diagnosis not present

## 2022-12-22 DIAGNOSIS — E559 Vitamin D deficiency, unspecified: Secondary | ICD-10-CM | POA: Diagnosis not present

## 2022-12-22 DIAGNOSIS — Z0000 Encounter for general adult medical examination without abnormal findings: Secondary | ICD-10-CM | POA: Diagnosis not present

## 2022-12-22 DIAGNOSIS — Z125 Encounter for screening for malignant neoplasm of prostate: Secondary | ICD-10-CM | POA: Diagnosis not present

## 2022-12-22 DIAGNOSIS — D518 Other vitamin B12 deficiency anemias: Secondary | ICD-10-CM | POA: Diagnosis not present

## 2022-12-22 DIAGNOSIS — E782 Mixed hyperlipidemia: Secondary | ICD-10-CM | POA: Diagnosis not present

## 2022-12-22 DIAGNOSIS — Z0001 Encounter for general adult medical examination with abnormal findings: Secondary | ICD-10-CM | POA: Diagnosis not present

## 2022-12-22 DIAGNOSIS — Z1331 Encounter for screening for depression: Secondary | ICD-10-CM | POA: Diagnosis not present

## 2022-12-22 DIAGNOSIS — Z23 Encounter for immunization: Secondary | ICD-10-CM | POA: Diagnosis not present

## 2023-02-10 DIAGNOSIS — M79671 Pain in right foot: Secondary | ICD-10-CM | POA: Diagnosis not present

## 2023-03-01 DIAGNOSIS — M7671 Peroneal tendinitis, right leg: Secondary | ICD-10-CM | POA: Diagnosis not present

## 2023-03-23 DIAGNOSIS — M25671 Stiffness of right ankle, not elsewhere classified: Secondary | ICD-10-CM | POA: Diagnosis not present

## 2023-03-23 DIAGNOSIS — M79671 Pain in right foot: Secondary | ICD-10-CM | POA: Diagnosis not present

## 2023-04-12 DIAGNOSIS — M25571 Pain in right ankle and joints of right foot: Secondary | ICD-10-CM | POA: Diagnosis not present

## 2023-04-12 DIAGNOSIS — M7671 Peroneal tendinitis, right leg: Secondary | ICD-10-CM | POA: Diagnosis not present

## 2023-04-12 DIAGNOSIS — M79671 Pain in right foot: Secondary | ICD-10-CM | POA: Diagnosis not present

## 2023-05-06 DIAGNOSIS — M25571 Pain in right ankle and joints of right foot: Secondary | ICD-10-CM | POA: Diagnosis not present

## 2023-05-19 DIAGNOSIS — R3121 Asymptomatic microscopic hematuria: Secondary | ICD-10-CM | POA: Diagnosis not present

## 2023-05-19 DIAGNOSIS — N401 Enlarged prostate with lower urinary tract symptoms: Secondary | ICD-10-CM | POA: Diagnosis not present

## 2023-05-19 DIAGNOSIS — N200 Calculus of kidney: Secondary | ICD-10-CM | POA: Diagnosis not present

## 2023-05-19 DIAGNOSIS — M7671 Peroneal tendinitis, right leg: Secondary | ICD-10-CM | POA: Diagnosis not present

## 2023-05-19 DIAGNOSIS — R351 Nocturia: Secondary | ICD-10-CM | POA: Diagnosis not present

## 2023-05-19 DIAGNOSIS — M2580 Other specified joint disorders, unspecified joint: Secondary | ICD-10-CM | POA: Diagnosis not present

## 2023-05-24 ENCOUNTER — Other Ambulatory Visit (HOSPITAL_COMMUNITY): Payer: Self-pay | Admitting: Urology

## 2023-05-24 DIAGNOSIS — R3129 Other microscopic hematuria: Secondary | ICD-10-CM

## 2023-05-24 DIAGNOSIS — N200 Calculus of kidney: Secondary | ICD-10-CM

## 2023-05-25 ENCOUNTER — Ambulatory Visit (HOSPITAL_COMMUNITY)
Admission: RE | Admit: 2023-05-25 | Discharge: 2023-05-25 | Disposition: A | Discharge status: Home / Self Care | Source: Ambulatory Visit | Attending: Urology | Admitting: Urology

## 2023-05-25 DIAGNOSIS — R3129 Other microscopic hematuria: Secondary | ICD-10-CM | POA: Diagnosis not present

## 2023-05-25 DIAGNOSIS — N200 Calculus of kidney: Secondary | ICD-10-CM | POA: Diagnosis not present

## 2023-05-25 DIAGNOSIS — R109 Unspecified abdominal pain: Secondary | ICD-10-CM | POA: Diagnosis not present

## 2023-05-25 DIAGNOSIS — N134 Hydroureter: Secondary | ICD-10-CM | POA: Diagnosis not present

## 2023-05-25 DIAGNOSIS — N132 Hydronephrosis with renal and ureteral calculous obstruction: Secondary | ICD-10-CM | POA: Diagnosis not present

## 2023-05-25 MED ORDER — SODIUM CHLORIDE 0.9 % IV SOLN
INTRAVENOUS | Status: AC
  Filled 2023-05-25: qty 250

## 2023-05-25 MED ORDER — IOHEXOL 300 MG/ML  SOLN
100.0000 mL | Freq: Once | INTRAMUSCULAR | Status: AC | PRN
  Administered 2023-05-25: 100 mL via INTRAVENOUS

## 2023-05-26 ENCOUNTER — Other Ambulatory Visit: Payer: Self-pay | Admitting: Urology

## 2023-05-26 ENCOUNTER — Encounter (HOSPITAL_COMMUNITY): Payer: Self-pay | Admitting: Urology

## 2023-05-26 DIAGNOSIS — N201 Calculus of ureter: Secondary | ICD-10-CM | POA: Diagnosis not present

## 2023-05-26 NOTE — Progress Notes (Signed)
 Spoke w/ via phone for pre-op interview---Nur Lab needs dos---- KUB              COVID test -----patient states asymptomatic no test needed Arrive at -------1030 NPO after MN NO Solid Food.  Clear liquids from MN until--- Med rec completed. Pt aware to hold ASA/NSAIDs and supplements per [facility] protocol.  Medications to take morning of surgery -----pain meds Diabetic/Weight loss medication ----- No Alcohol or recreational drugs for 24 hours/Tobacco products for 6 hours ---- Patient instructed to bring blue lithotripsy folder, photo id and insurance card day of surgery. Patient aware to have Driver (ride ) / caregiver for 24 hours after surgery -----Justin [name] [phone] Patient Special Instructions ----- Pre-Op special Instructions ----- take laxative of choice day before procedure Patient verbalized understanding of instructions that were given at this phone interview. Patient denies shortness of breath, chest pain, fever, cough at this phone interview.

## 2023-05-28 ENCOUNTER — Ambulatory Visit (HOSPITAL_COMMUNITY): Admission: RE | Admit: 2023-05-28 | Source: Home / Self Care | Admitting: Urology

## 2023-05-28 SURGERY — LITHOTRIPSY, ESWL
Anesthesia: LOCAL | Laterality: Left

## 2023-06-03 ENCOUNTER — Telehealth: Payer: Self-pay | Admitting: Cardiovascular Disease

## 2023-06-03 NOTE — Telephone Encounter (Signed)
 Patient dropped of a CT Report. Will leave in provider box

## 2023-06-04 ENCOUNTER — Telehealth: Payer: Self-pay | Admitting: Pulmonary Disease

## 2023-06-04 NOTE — Telephone Encounter (Signed)
 Reviewed CT report that patient dropped off >> mild scarring right lower lobe versus splinting when he had left flank pain , no intervention at this time. He can contact us if he has symptoms or keep annual follow-up appointment

## 2023-06-04 NOTE — Telephone Encounter (Signed)
 Pt notified on identifiable VM

## 2023-06-04 NOTE — Telephone Encounter (Signed)
 CT report reviewed by provider. Document scanned into patient chart.

## 2023-06-24 DIAGNOSIS — Z1283 Encounter for screening for malignant neoplasm of skin: Secondary | ICD-10-CM | POA: Diagnosis not present

## 2023-06-24 DIAGNOSIS — X32XXXA Exposure to sunlight, initial encounter: Secondary | ICD-10-CM | POA: Diagnosis not present

## 2023-06-24 DIAGNOSIS — L570 Actinic keratosis: Secondary | ICD-10-CM | POA: Diagnosis not present

## 2023-06-24 DIAGNOSIS — D225 Melanocytic nevi of trunk: Secondary | ICD-10-CM | POA: Diagnosis not present

## 2023-07-29 DIAGNOSIS — R3121 Asymptomatic microscopic hematuria: Secondary | ICD-10-CM | POA: Diagnosis not present

## 2023-07-29 DIAGNOSIS — N401 Enlarged prostate with lower urinary tract symptoms: Secondary | ICD-10-CM | POA: Diagnosis not present

## 2023-07-29 DIAGNOSIS — R351 Nocturia: Secondary | ICD-10-CM | POA: Diagnosis not present

## 2023-11-15 DIAGNOSIS — K047 Periapical abscess without sinus: Secondary | ICD-10-CM | POA: Diagnosis not present

## 2023-11-15 DIAGNOSIS — K0510 Chronic gingivitis, plaque induced: Secondary | ICD-10-CM | POA: Diagnosis not present

## 2023-11-15 DIAGNOSIS — K120 Recurrent oral aphthae: Secondary | ICD-10-CM | POA: Diagnosis not present

## 2023-12-06 NOTE — Progress Notes (Unsigned)
 Cardiology Office Note:  .   Date:  12/08/2023  ID:  Justin [name], DOB 1950-11-07, MRN [id number] PCP: Justin Norleen, MD  [facility] Providers Cardiologist:  Darryle ONEIDA Decent, MD    History of Present Illness: .    Chief Complaint  Patient presents with   Follow-up         Justin [name] is a 73 y.o. male with history of HTN, CAD, HLD, Atach who presents for follow-up.     History of Present Illness   Justin [name] is a 73 year old male with hypertension, hyperlipidemia, and atrial tachycardia who presents for follow-up. His wife accompanied him to the visit but did not enter the examination room.  He has a history of atrial tachycardia, characterized by a sensation of a 'skip' in his heart, which he can feel in his throat. This sensation has not occurred in the past few months. Previously, these episodes left him feeling fatigued and unable to perform daily activities. He is not currently on a beta blocker.  He manages his hypertension with losartan , taking either 25 mg or 50 mg based on his daily blood pressure readings, which can range from 110/80 to 150/90 mmHg. The patient has noticed that if his heart rate goes up, such as into the 70s, his blood pressure can also increase. He monitors his blood pressure daily.  For hyperlipidemia, he takes Crestor  20 mg daily. His LDL cholesterol has increased from 72 to 112 mg/dL since his last check in November. He sometimes forgets to take his medication at night. He has a history of elevated coronary calcium  score but has never experienced a heart attack or stroke.  He previously took aspirin  but discontinued it due to excessive bruising and bleeding. He easily bruises from minor scratches or bumps, which he attributes to aspirin . 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  No chest pain or trouble breathing. 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          Problem List 1.  Hypertension 2.  Hyperlipidemia -T chol 139, LDL 50, HDL 72, TG 91 3. Ectopic atrial tachycardia -brief ~7.6 seconds 4. CAD -CAC score 765 (82nd percentile)    ROS: All other ROS reviewed and negative. Pertinent positives noted in the HPI.     Studies Reviewed: SABRA   EKG Interpretation Date/Time:  Wednesday December 08 2023 10:10:49 EDT Ventricular Rate:  49 PR Interval:  198 QRS Duration:  92 QT Interval:  440 QTC Calculation: 397 R Axis:   13  Text Interpretation: Sinus bradycardia Confirmed by Decent Darryle [phone]) on 12/08/2023 10:22:13 AM    TTE 06/13/2020  1. Left ventricular ejection fraction, by estimation, is 55 to 60%. The  left ventricle has normal function. The left ventricle has no regional  wall motion abnormalities. Left ventricular diastolic parameters are  consistent with Grade I diastolic  dysfunction (impaired relaxation).   2. Right ventricular systolic function is normal. The right ventricular  size is normal.   3. The mitral valve is normal in structure. No evidence of mitral valve  regurgitation. No evidence of mitral stenosis.   4. The aortic valve is tricuspid. Aortic valve regurgitation is not  visualized. No aortic stenosis is present.   5. Aortic dilatation noted. There is mild dilatation of the aortic root,  measuring 40 mm. There is mild dilatation of  the ascending aorta,  measuring 38 mm.   6. The inferior vena cava is normal in size with greater than 50%  respiratory variability, suggesting right atrial pressure of 3 mmHg.  Physical Exam:   VS:  BP 110/80   Pulse (!) 49   Ht 6' (1.829 m)   Wt 201 lb (91.2 kg)   SpO2 98%   BMI 27.26 kg/m    Wt Readings from Last 3 Encounters:  12/08/23 201 lb (91.2 kg)  10/07/22 203 lb 6.4 oz (92.3 kg)  05/06/22 208 lb (94.3 kg)    GEN: Well nourished, well developed in no acute distress NECK: No JVD; No carotid bruits CARDIAC: RRR, no murmurs, rubs,  gallops RESPIRATORY:  Clear to auscultation without rales, wheezing or rhonchi  ABDOMEN: Soft, non-tender, non-distended EXTREMITIES:  No edema; No deformity  ASSESSMENT AND PLAN: .   Assessment and Plan    Atrial tachycardia with infrequent symptoms Symptoms absent for several months, not impacting daily activities. - Monitor symptoms conservatively.  Sinus bradycardia, asymptomatic Sinus bradycardia with heart rate of 49 bpm, asymptomatic, no acute ischemic changes. - Monitor heart rate and symptoms.  Mixed hyperlipidemia with elevated coronary calcium  score Mixed hyperlipidemia with coronary calcium  score of 785 (82nd percentile). Previous LDL 112. On Crestor  20 mg daily with occasional non-compliance. Aspirin  discontinued due to bruising; considering Plavix. - Check lipid panel and liver function tests today. - Prescribe Plavix 75 mg daily. Did not tolerate ASA due to bruising.  - Encourage adherence to Crestor  20 mg daily. - Consider PCSK9i; follow up on lipid levels and adjust treatment as necessary.  Hypertension, well controlled on losartan  Hypertension controlled with losartan  25 mg daily, blood pressure generally within acceptable range. - Continue losartan  25 mg daily. - Monitor blood pressure regularly. - Adjust dosage based on blood pressure readings.              Follow-up: Return in about 1 year (around 12/07/2024).  Signed, Darryle DASEN. Barbaraann, MD, [facility]  [facility]  [address] [place], [place] [address] [phone]  10:50 AM

## 2023-12-08 ENCOUNTER — Ambulatory Visit: Attending: Cardiovascular Disease | Admitting: Cardiovascular Disease

## 2023-12-08 ENCOUNTER — Encounter: Payer: Self-pay | Admitting: Cardiovascular Disease

## 2023-12-08 VITALS — BP 110/80 | HR 49 | Ht 72.0 in | Wt 201.0 lb

## 2023-12-08 DIAGNOSIS — I2510 Atherosclerotic heart disease of native coronary artery without angina pectoris: Secondary | ICD-10-CM | POA: Diagnosis present

## 2023-12-08 DIAGNOSIS — R931 Abnormal findings on diagnostic imaging of heart and coronary circulation: Secondary | ICD-10-CM | POA: Insufficient documentation

## 2023-12-08 DIAGNOSIS — I4719 Other supraventricular tachycardia: Secondary | ICD-10-CM | POA: Insufficient documentation

## 2023-12-08 DIAGNOSIS — E782 Mixed hyperlipidemia: Secondary | ICD-10-CM | POA: Insufficient documentation

## 2023-12-08 DIAGNOSIS — I10 Essential (primary) hypertension: Secondary | ICD-10-CM | POA: Insufficient documentation

## 2023-12-08 MED ORDER — CLOPIDOGREL BISULFATE 75 MG PO TABS: 75.0000 mg | ORAL_TABLET | Freq: Every day | ORAL | 3 refills | Status: DC

## 2023-12-08 NOTE — Patient Instructions (Signed)
 Medication Instructions:  Your physician has recommended you make the following change in your medication:  START: clopidogrel (Plavix) 75 mg by mouth once daily  *If you need a refill on your cardiac medications before your next appointment, please call your pharmacy*  Lab Work: Lipid Panel and liver function  If you have labs (blood work) drawn today and your tests are completely normal, you will receive your results only by: MyChart Message (if you have MyChart) OR A paper copy in the mail If you have any lab test that is abnormal or we need to change your treatment, we will call you to review the results.  Testing/Procedures: NONE  Follow-Up: At [facility], you and your health needs are our priority.  As part of our continuing mission to provide you with exceptional heart care, our providers are all part of one team.  This team includes your primary Cardiologist (physician) and Advanced Practice Providers or APPs (Physician Assistants and Nurse Practitioners) who all work together to provide you with the care you need, when you need it.  Your next appointment:   1 year(s)  Provider:   Darryle ONEIDA Decent, MD

## 2023-12-09 LAB — LIPID PANEL
Chol/HDL Ratio: 2.1 ratio (ref 0.0–5.0)
Cholesterol, Total: 148 mg/dL (ref 100–199)
HDL: 72 mg/dL (ref >39)
LDL Chol Calc (NIH): 64 mg/dL (ref 0–99)
Triglycerides: 59 mg/dL (ref 0–149)
VLDL Cholesterol Cal: 12 mg/dL (ref 5–40)

## 2023-12-13 ENCOUNTER — Ambulatory Visit: Payer: Self-pay | Admitting: Cardiovascular Disease

## 2023-12-27 DIAGNOSIS — F411 Generalized anxiety disorder: Secondary | ICD-10-CM | POA: Diagnosis not present

## 2023-12-27 DIAGNOSIS — Z23 Encounter for immunization: Secondary | ICD-10-CM | POA: Diagnosis not present

## 2023-12-27 DIAGNOSIS — E559 Vitamin D deficiency, unspecified: Secondary | ICD-10-CM | POA: Diagnosis not present

## 2023-12-27 DIAGNOSIS — R030 Elevated blood-pressure reading, without diagnosis of hypertension: Secondary | ICD-10-CM | POA: Diagnosis not present

## 2023-12-27 DIAGNOSIS — N401 Enlarged prostate with lower urinary tract symptoms: Secondary | ICD-10-CM | POA: Diagnosis not present

## 2023-12-27 DIAGNOSIS — I10 Essential (primary) hypertension: Secondary | ICD-10-CM | POA: Diagnosis not present

## 2023-12-27 DIAGNOSIS — E039 Hypothyroidism, unspecified: Secondary | ICD-10-CM | POA: Diagnosis not present

## 2023-12-27 DIAGNOSIS — K219 Gastro-esophageal reflux disease without esophagitis: Secondary | ICD-10-CM | POA: Diagnosis not present

## 2023-12-27 DIAGNOSIS — I2510 Atherosclerotic heart disease of native coronary artery without angina pectoris: Secondary | ICD-10-CM | POA: Diagnosis not present

## 2023-12-27 DIAGNOSIS — D696 Thrombocytopenia, unspecified: Secondary | ICD-10-CM | POA: Diagnosis not present

## 2023-12-27 DIAGNOSIS — Z0000 Encounter for general adult medical examination without abnormal findings: Secondary | ICD-10-CM | POA: Diagnosis not present

## 2023-12-27 DIAGNOSIS — R5382 Chronic fatigue, unspecified: Secondary | ICD-10-CM | POA: Diagnosis not present

## 2023-12-27 DIAGNOSIS — E785 Hyperlipidemia, unspecified: Secondary | ICD-10-CM | POA: Diagnosis not present

## 2023-12-29 ENCOUNTER — Encounter (INDEPENDENT_AMBULATORY_CARE_PROVIDER_SITE_OTHER): Payer: Self-pay | Admitting: *Deleted

## 2024-01-13 ENCOUNTER — Telehealth: Payer: Self-pay | Admitting: Cardiovascular Disease

## 2024-01-13 NOTE — Telephone Encounter (Signed)
  Per MyChart scheduling message:  I have questions,  Dr. Barbaraann has me on Rosuvastatin  for cholesterol. When taking this medicine I feel achy, my muscles ache, and I don't know why, but I have to urinate much more than usual, day and night when taking. He also put me on Clopidogrel  75mg . , it's an anti platelet medication. I had blood work done November 3rd. It shows that my platelets are low, they have been low as long as I've been doing blood work. My question is, should I take this medication with my platelets count already being low? I would like to make another appointment with Dr Barbaraann to discuss my concerns.

## 2024-01-14 NOTE — Telephone Encounter (Signed)
 Patient reports he stopped taking rosuvastatin  and muscle pain has improved. He feels good now and would like to follow-up with Dr. Barbaraann to discuss his labs and medications. Patient states it is not urgent, accepted appt with Dr. Barbaraann on 03/10/24. Patient will bring a copy of his labs to his appt.

## 2024-01-26 DIAGNOSIS — N5203 Combined arterial insufficiency and corporo-venous occlusive erectile dysfunction: Secondary | ICD-10-CM | POA: Diagnosis not present

## 2024-01-26 DIAGNOSIS — R3912 Poor urinary stream: Secondary | ICD-10-CM | POA: Diagnosis not present

## 2024-01-26 DIAGNOSIS — N200 Calculus of kidney: Secondary | ICD-10-CM | POA: Diagnosis not present

## 2024-01-26 DIAGNOSIS — N401 Enlarged prostate with lower urinary tract symptoms: Secondary | ICD-10-CM | POA: Diagnosis not present

## 2024-03-06 NOTE — Progress Notes (Unsigned)
 " Cardiology Office Note:  .   Date:  03/10/2024  ID:  Justin [name], DOB [birth year], MRN [id number] PCP: [name], MD  [facility] Providers Cardiologist:  Darryle ONEIDA Decent, MD   History of Present Illness: .    Chief Complaint  Patient presents with   Follow-up    Justin [name] is a 74 y.o. male with below history who presents for follow-up.   History of Present Illness   Justin [name] is a 74 year old male with hypertension, hyperlipidemia, and atrial tachycardia who presents for follow-up.  He experiences muscle pain, particularly in the legs, when taking statins for hyperlipidemia. The pain subsides when the medication is stopped. He is interested in exploring alternative treatments that do not cause discomfort.  He is currently on clopidogrel  due to bruising experienced with aspirin . He is concerned about bleeding and bruising, noting that even minor injuries result in significant bruising. He questions the efficacy of clopidogrel  compared to aspirin  and mentions a history of low platelet counts.  His blood pressure readings are variable, with episodes of both high and low blood pressure. The average reading is 142/78 mmHg over 43 readings. He mentions days without medication when his blood pressure is low and days when it spikes despite medication. He is currently taking 50 mg of his blood pressure medication in the morning and evening when needed.  He mentions a history of heart palpitations but notes that he has not experienced them recently.          Problem List 1.  HTN 2.  HLD -T chol 148, HDL 72, LDL 64, TG 59 3. Ectopic atrial tachycardia -brief ~7.6 seconds 4. CAD -CAC score 765 (82nd percentile)    ROS: All other ROS reviewed and negative. Pertinent positives noted in the HPI.     Studies Reviewed: SABRA       Physical Exam:   VS:  BP (!) 155/79 (BP Location: Left Arm, Patient Position: Sitting)   Pulse 65   Ht 6' (1.829 m)   Wt 206 lb 9.6  oz (93.7 kg)   SpO2 99%   BMI 28.02 kg/m    Wt Readings from Last 3 Encounters:  03/10/24 206 lb 9.6 oz (93.7 kg)  12/08/23 201 lb (91.2 kg)  10/07/22 203 lb 6.4 oz (92.3 kg)    GEN: Well nourished, well developed in no acute distress NECK: No JVD; No carotid bruits CARDIAC: RRR, no murmurs, rubs, gallops RESPIRATORY:  Clear to auscultation without rales, wheezing or rhonchi  ABDOMEN: Soft, non-tender, non-distended EXTREMITIES:  No edema; No deformity  ASSESSMENT AND PLAN: .   Assessment and Plan    Coronary artery disease with elevated coronary calcium  Elevated coronary calcium  score. On Plavix  due to bruising on aspirin . Platelet count 139, not concerning for spontaneous bleeding. Discussed aspirin  and Plavix  effects; Plavix  may have less bleeding risk. Prefers aspirin  every other day. - Discontinued Plavix . - Initiated aspirin  81 mg every other day.  Mixed hyperlipidemia CAD Elevated CAC score 765 (82nd percentile) Muscle pain likely due to statin use. Discussed alternative treatments including injectables. Interested in switching to injectable medication. - Referred to pharmacy for evaluation of newer cholesterol medications, including injectable options. - Will discuss potential for every two weeks injection or every six months injection based on insurance coverage.  Primary hypertension Blood pressure readings variable, some above target. Current regimen 50 mg twice daily. Discussed stress and anxiety impact. Monitoring with cellular device, noted fluctuations. -  Continue current blood pressure monitoring regimen. - Will discuss potential adjustments to medication timing to better manage blood pressure fluctuations.                Follow-up: Return in about 1 year (around 03/10/2025).  Signed, Darryle DASEN. Barbaraann, MD, [facility]  [facility]  [address] [place], [place] [address] [phone]  9:55 AM   "

## 2024-03-10 ENCOUNTER — Ambulatory Visit: Attending: Cardiovascular Disease | Admitting: Cardiovascular Disease

## 2024-03-10 ENCOUNTER — Encounter: Payer: Self-pay | Admitting: Cardiovascular Disease

## 2024-03-10 VITALS — BP 155/79 | HR 65 | Ht 72.0 in | Wt 206.6 lb

## 2024-03-10 DIAGNOSIS — I10 Essential (primary) hypertension: Secondary | ICD-10-CM | POA: Diagnosis not present

## 2024-03-10 DIAGNOSIS — I4719 Other supraventricular tachycardia: Secondary | ICD-10-CM

## 2024-03-10 DIAGNOSIS — I2510 Atherosclerotic heart disease of native coronary artery without angina pectoris: Secondary | ICD-10-CM | POA: Diagnosis not present

## 2024-03-10 DIAGNOSIS — R931 Abnormal findings on diagnostic imaging of heart and coronary circulation: Secondary | ICD-10-CM | POA: Diagnosis not present

## 2024-03-10 DIAGNOSIS — E782 Mixed hyperlipidemia: Secondary | ICD-10-CM

## 2024-03-10 MED ORDER — ASPIRIN 81 MG PO TBEC: 81.0000 mg | DELAYED_RELEASE_TABLET | ORAL | Status: AC

## 2024-03-10 NOTE — Addendum Note (Signed)
 Addended by: Emerlyn Mehlhoff W on: 03/10/2024 10:04 AM   Modules accepted: Orders

## 2024-03-10 NOTE — Patient Instructions (Signed)
 Medication Instructions:   STOP PLAVIX   START ASPIRIN  81 MG EVERY OTHER DAY  *If you need a refill on your cardiac medications before your next appointment, please call your pharmacy*   Follow-Up: At [facility], you and your health needs are our priority.  As part of our continuing mission to provide you with exceptional heart care, our providers are all part of one team.  This team includes your primary Cardiologist (physician) and Advanced Practice Providers or APPs (Physician Assistants and Nurse Practitioners) who all work together to provide you with the care you need, when you need it.  Your next appointment:   12 month(s)  Provider:   Darryle ONEIDA Decent, MD

## 2024-03-10 NOTE — Addendum Note (Signed)
 Addended by: Krystan Northrop W on: 03/10/2024 09:57 AM   Modules accepted: Orders

## 2024-04-27 ENCOUNTER — Ambulatory Visit: Admitting: Pharmacist Clinician (PhC)/ Clinical Pharmacy Specialist
